# Patient Record
Sex: Male | Born: 1975 | Race: Black or African American | Hispanic: No | Marital: Single | Smoking: Current every day smoker
Health system: Southern US, Community
[De-identification: ages and names within clinical notes are randomized; demographics above are authoritative.]

## PROBLEM LIST (undated history)

## (undated) DIAGNOSIS — I10 Essential (primary) hypertension: Secondary | ICD-10-CM

## (undated) DIAGNOSIS — J45909 Unspecified asthma, uncomplicated: Secondary | ICD-10-CM

## (undated) DIAGNOSIS — R0981 Nasal congestion: Secondary | ICD-10-CM

---

## 2003-06-25 ENCOUNTER — Emergency Department (HOSPITAL_COMMUNITY): Admission: EM | Admit: 2003-06-25 | Discharge: 2003-06-25 | Payer: Self-pay | Admitting: Emergency Medicine

## 2018-03-05 ENCOUNTER — Emergency Department (HOSPITAL_COMMUNITY)
Admission: EM | Admit: 2018-03-05 | Discharge: 2018-03-05 | Disposition: A | Discharge status: Home / Self Care | Payer: Self-pay | Attending: Emergency Medicine | Admitting: Emergency Medicine

## 2018-03-05 ENCOUNTER — Other Ambulatory Visit: Payer: Self-pay

## 2018-03-05 ENCOUNTER — Emergency Department (HOSPITAL_COMMUNITY): Payer: Self-pay

## 2018-03-05 ENCOUNTER — Encounter (HOSPITAL_COMMUNITY): Payer: Self-pay

## 2018-03-05 DIAGNOSIS — J101 Influenza due to other identified influenza virus with other respiratory manifestations: Secondary | ICD-10-CM | POA: Insufficient documentation

## 2018-03-05 DIAGNOSIS — I10 Essential (primary) hypertension: Secondary | ICD-10-CM | POA: Insufficient documentation

## 2018-03-05 DIAGNOSIS — Z79899 Other long term (current) drug therapy: Secondary | ICD-10-CM | POA: Insufficient documentation

## 2018-03-05 HISTORY — DX: Essential (primary) hypertension: I10

## 2018-03-05 LAB — CBC
HCT: 46.9 % (ref 39.0–52.0)
Hemoglobin: 16.6 g/dL (ref 13.0–17.0)
MCH: 32.5 pg (ref 26.0–34.0)
MCHC: 35.4 g/dL (ref 30.0–36.0)
MCV: 91.8 fL (ref 78.0–100.0)
PLATELETS: 165 10*3/uL (ref 150–400)
RBC: 5.11 MIL/uL (ref 4.22–5.81)
RDW: 13.1 % (ref 11.5–15.5)
WBC: 4 10*3/uL (ref 4.0–10.5)

## 2018-03-05 LAB — BASIC METABOLIC PANEL
Anion gap: 11 (ref 5–15)
BUN: 9 mg/dL (ref 6–20)
CALCIUM: 8.5 mg/dL — AB (ref 8.9–10.3)
CHLORIDE: 102 mmol/L (ref 101–111)
CO2: 23 mmol/L (ref 22–32)
CREATININE: 1.27 mg/dL — AB (ref 0.61–1.24)
GFR calc non Af Amer: >60 mL/min (ref >60)
Glucose, Bld: 113 mg/dL — ABNORMAL HIGH (ref 65–99)
Potassium: 3.2 mmol/L — ABNORMAL LOW (ref 3.5–5.1)
SODIUM: 136 mmol/L (ref 135–145)

## 2018-03-05 LAB — I-STAT TROPONIN, ED: TROPONIN I, POC: 0.02 ng/mL (ref 0.00–0.08)

## 2018-03-05 MED ORDER — KETOROLAC TROMETHAMINE 30 MG/ML IJ SOLN
15.0000 mg | Freq: Once | INTRAMUSCULAR | Status: AC
  Administered 2018-03-05: 15 mg via INTRAVENOUS
  Filled 2018-03-05: qty 1

## 2018-03-05 MED ORDER — METOCLOPRAMIDE HCL 5 MG/ML IJ SOLN
10.0000 mg | Freq: Once | INTRAMUSCULAR | Status: AC
  Administered 2018-03-05: 10 mg via INTRAVENOUS
  Filled 2018-03-05: qty 2

## 2018-03-05 MED ORDER — CETIRIZINE-PSEUDOEPHEDRINE ER 5-120 MG PO TB12: 1.0000 | ORAL_TABLET | Freq: Every day | ORAL | 0 refills | Status: DC

## 2018-03-05 MED ORDER — SODIUM CHLORIDE 0.9 % IV BOLUS (SEPSIS)
1000.0000 mL | Freq: Once | INTRAVENOUS | Status: AC
  Administered 2018-03-05: 1000 mL via INTRAVENOUS

## 2018-03-05 MED ORDER — ACETAMINOPHEN 325 MG PO TABS
650.0000 mg | ORAL_TABLET | Freq: Once | ORAL | Status: AC | PRN
  Administered 2018-03-05: 650 mg via ORAL
  Filled 2018-03-05: qty 2

## 2018-03-05 MED ORDER — ONDANSETRON HCL 4 MG PO TABS: 4.0000 mg | ORAL_TABLET | Freq: Four times a day (QID) | ORAL | 0 refills | Status: DC

## 2018-03-05 MED ORDER — BENZONATATE 100 MG PO CAPS: 100.0000 mg | ORAL_CAPSULE | Freq: Three times a day (TID) | ORAL | 0 refills | Status: DC

## 2018-03-05 MED ORDER — ALBUTEROL SULFATE (2.5 MG/3ML) 0.083% IN NEBU
5.0000 mg | INHALATION_SOLUTION | Freq: Once | RESPIRATORY_TRACT | Status: AC
  Administered 2018-03-05: 5 mg via RESPIRATORY_TRACT
  Filled 2018-03-05: qty 6

## 2018-03-05 NOTE — Discharge Instructions (Signed)
Alternate ibuprofen and Tylenol as prescribed over-the-counter every 4 hours for fever and body aches. Please follow-up with your doctor if your symptoms are persisting after the next week.  Please return to emergency department if you develop any new or worsening symptoms.

## 2018-03-05 NOTE — ED Triage Notes (Signed)
Pt states he has had cough, shortness of breath, sinus pain. Pt states he has been feeling bad X2 days. Pt also reports dry mouth. No medical hx per patient.

## 2018-03-05 NOTE — ED Provider Notes (Signed)
[facility] EMERGENCY DEPARTMENT Provider Note   CSN: [id number] Arrival date & time: 03/05/18  0758     History   Chief Complaint Chief Complaint  Patient presents with  . URI  . Headache    HPI Andre Dean is a 42 y.o. male with history of hypertension who presents with a 4-day history of cough, nasal congestion, fever, body aches.  Patient was seen at urgent care 2 days ago and diagnosed with influenza A.  He has had some associated shortness of breath, headache.  He has been taking over-the-counter Corcidin and an over-the-counter cold and flu medication.  He has had some associated nausea, but no abdominal pain or vomiting, or diarrhea.  He has had some soreness in his chest with coughing.  HPI  Past Medical History:  Diagnosis Date  . Hypertension     There are no active problems to display for this patient.   History reviewed. No pertinent surgical history.     Home Medications    Prior to Admission medications   Medication Sig Start Date End Date Taking? Authorizing Provider  acetaminophen (TYLENOL) 500 MG tablet Take 1,000 mg by mouth every 6 (six) hours as needed for mild pain.   Yes [provider]  Black Elderberry (SAMBUCUS ELDERBERRY PO) Take 1 tablet by mouth 3 (three) times daily.   Yes [provider]  Black Elderberry (SAMBUCUS ELDERBERRY) 50 MG/5ML SYRP Take 10 mLs by mouth every 6 (six) hours as needed (cough).   Yes [provider]  benzonatate (TESSALON) 100 MG capsule Take 1 capsule (100 mg total) by mouth every 8 (eight) hours. 03/05/18   Myleigh Amara, Waylan Boga, PA-C  cetirizine-pseudoephedrine (ZYRTEC-D) 5-120 MG tablet Take 1 tablet by mouth daily. 03/05/18   Demaryius Imran, Waylan Boga, PA-C  ondansetron (ZOFRAN) 4 MG tablet Take 1 tablet (4 mg total) by mouth every 6 (six) hours. 03/05/18   Emi Holes, PA-C    Family History History reviewed. No pertinent family history.  Social History Social History    Tobacco Use  . Smoking status: Never Smoker  . Smokeless tobacco: Never Used  Substance Use Topics  . Alcohol use: Yes    Comment: occasional  . Drug use: No     Allergies   Patient has no known allergies.   Review of Systems Review of Systems  Constitutional: Positive for chills and fever.  HENT: Positive for congestion and sore throat. Negative for facial swelling.   Respiratory: Positive for cough and shortness of breath.   Cardiovascular: Negative for chest pain.  Gastrointestinal: Positive for nausea. Negative for abdominal pain and vomiting.  Genitourinary: Negative for dysuria.  Musculoskeletal: Positive for myalgias. Negative for back pain.  Skin: Negative for rash and wound.  Neurological: Negative for headaches.  Psychiatric/Behavioral: The patient is not nervous/anxious.      Physical Exam Updated Vital Signs BP (!) 160/109   Pulse 84   Temp (!) 101.2 F (38.4 C) (Oral)   Resp 18   SpO2 94%   Physical Exam  Constitutional: He appears well-developed and well-nourished. No distress.  HENT:  Head: Normocephalic and atraumatic.  Mouth/Throat: Oropharynx is clear and moist. No oropharyngeal exudate.  Eyes: Conjunctivae are normal. Pupils are equal, round, and reactive to light. Right eye exhibits no discharge. Left eye exhibits no discharge. No scleral icterus.  Neck: Normal range of motion. Neck supple. No thyromegaly present.  Cardiovascular: Normal rate, regular rhythm, normal heart sounds and intact distal pulses. Exam  reveals no gallop and no friction rub.  No murmur heard. Pulmonary/Chest: Effort normal. No stridor. No respiratory distress. He has wheezes. He has no rales.  Abdominal: Soft. Bowel sounds are normal. He exhibits no distension. There is no tenderness. There is no rebound and no guarding.  Musculoskeletal: He exhibits no edema.  Lymphadenopathy:    He has no cervical adenopathy.  Neurological: He is alert. Coordination normal.  Skin:  Skin is warm and dry. No rash noted. He is not diaphoretic. No pallor.  Psychiatric: He has a normal mood and affect.  Nursing note and vitals reviewed.    ED Treatments / Results  Labs (all labs ordered are listed, but only abnormal results are displayed) Labs Reviewed  BASIC METABOLIC PANEL - Abnormal; Notable for the following components:      Result Value   Potassium 3.2 (*)    Glucose, Bld 113 (*)    Creatinine, Ser 1.27 (*)    Calcium 8.5 (*)    All other components within normal limits  CBC  I-STAT TROPONIN, ED    EKG  EKG Interpretation  Date/Time:  Thursday March 05 2018 08:10:17 EST Ventricular Rate:  94 PR Interval:  184 QRS Duration: 88 QT Interval:  372 QTC Calculation: 465 R Axis:   70 Text Interpretation:  Sinus rhythm with occasional Premature ventricular complexes Biatrial enlargement Left ventricular hypertrophy Nonspecific T wave abnormality No previous tracing Confirmed by Cathren LaineSteinl, Kevin ([id number]) on 03/05/2018 1:55:46 PM       Radiology Dg Chest 2 View  Result Date: 03/05/2018 CLINICAL DATA:  Cough, shortness of breath. EXAM: CHEST - 2 VIEW COMPARISON:  None. FINDINGS: Borderline cardiomegaly. Normal pulmonary vascularity. No focal consolidation, pleural effusion, or pneumothorax. No acute osseous abnormality. IMPRESSION: No active cardiopulmonary disease. Electronically Signed   By: Obie DredgeWilliam T Derry M.D.   On: 03/05/2018 09:36    Procedures Procedures (including critical care time)  Medications Ordered in ED Medications  acetaminophen (TYLENOL) tablet 650 mg (650 mg Oral Given 03/05/18 0813)  ketorolac (TORADOL) 30 MG/ML injection 15 mg (15 mg Intravenous Given 03/05/18 1221)  sodium chloride 0.9 % bolus 1,000 mL (0 mLs Intravenous Stopped 03/05/18 1328)  metoCLOPramide (REGLAN) injection 10 mg (10 mg Intravenous Given 03/05/18 1221)  albuterol (PROVENTIL) (2.5 MG/3ML) 0.083% nebulizer solution 5 mg (5 mg Nebulization Given 03/05/18 1221)     Initial  Impression / Assessment and Plan / ED Course  I have reviewed the triage vital signs and the nursing notes.  Pertinent labs & imaging results that were available during my care of the patient were reviewed by me and considered in my medical decision making (see chart for details).     Patient with influenza a.  CBC unremarkable.  BMP shows mild hypokalemia, 3.2, creatinine 1.27.  Troponin negative.  Chest x-ray is negative.  Patient feeling much better after albuterol nebulizer and headache cocktail and would like to leave.  He is dressed and ready to go before I could re-evaluate, and no repeat vitals could be assessed prior to discharge.  Will discharge home with Zofran, Tessalon, Zyrtec-D.  Follow-up to PCP for recheck.  Return precautions discussed.  Patient understands and agrees with plan.  Patient discharged in satisfactory condition.  Final Clinical Impressions(s) / ED Diagnoses   Final diagnoses:  Influenza A    ED Discharge Orders        Ordered    ondansetron (ZOFRAN) 4 MG tablet  Every 6 hours     03/05/18 1403  benzonatate (TESSALON) 100 MG capsule  Every 8 hours     03/05/18 1403    cetirizine-pseudoephedrine (ZYRTEC-D) 5-120 MG tablet  Daily     03/05/18 1403       Emi Holes, PA-C 03/05/18 1636    Cathren Laine, MD 03/06/18 [phone]

## 2018-06-19 DIAGNOSIS — J302 Other seasonal allergic rhinitis: Secondary | ICD-10-CM | POA: Insufficient documentation

## 2018-06-19 DIAGNOSIS — J45909 Unspecified asthma, uncomplicated: Secondary | ICD-10-CM | POA: Insufficient documentation

## 2018-06-19 DIAGNOSIS — R519 Headache, unspecified: Secondary | ICD-10-CM | POA: Insufficient documentation

## 2018-06-19 DIAGNOSIS — J4540 Moderate persistent asthma, uncomplicated: Secondary | ICD-10-CM | POA: Insufficient documentation

## 2018-06-19 DIAGNOSIS — B353 Tinea pedis: Secondary | ICD-10-CM | POA: Insufficient documentation

## 2018-06-19 DIAGNOSIS — I10 Essential (primary) hypertension: Secondary | ICD-10-CM | POA: Insufficient documentation

## 2018-09-04 ENCOUNTER — Emergency Department (HOSPITAL_COMMUNITY)
Admission: EM | Admit: 2018-09-04 | Discharge: 2018-09-05 | Disposition: A | Discharge status: Home / Self Care | Payer: Self-pay | Attending: Emergency Medicine | Admitting: Emergency Medicine

## 2018-09-04 ENCOUNTER — Encounter (HOSPITAL_COMMUNITY): Payer: Self-pay | Admitting: Emergency Medicine

## 2018-09-04 ENCOUNTER — Other Ambulatory Visit: Payer: Self-pay

## 2018-09-04 DIAGNOSIS — J9801 Acute bronchospasm: Secondary | ICD-10-CM | POA: Insufficient documentation

## 2018-09-04 DIAGNOSIS — Z79899 Other long term (current) drug therapy: Secondary | ICD-10-CM | POA: Insufficient documentation

## 2018-09-04 DIAGNOSIS — I10 Essential (primary) hypertension: Secondary | ICD-10-CM | POA: Insufficient documentation

## 2018-09-04 DIAGNOSIS — F17210 Nicotine dependence, cigarettes, uncomplicated: Secondary | ICD-10-CM | POA: Insufficient documentation

## 2018-09-04 DIAGNOSIS — J069 Acute upper respiratory infection, unspecified: Secondary | ICD-10-CM

## 2018-09-04 DIAGNOSIS — R9431 Abnormal electrocardiogram [ECG] [EKG]: Secondary | ICD-10-CM | POA: Insufficient documentation

## 2018-09-04 HISTORY — DX: Nasal congestion: R09.81

## 2018-09-04 HISTORY — DX: Unspecified asthma, uncomplicated: J45.909

## 2018-09-04 NOTE — ED Triage Notes (Addendum)
Pt reports 8/10 central chest pressure without radiation that started tonight. Pt reports sob that has been going on for the last week that has gotten worse. Pt reports attempting breathing treatments at home without any relief. Pt reports taking medications tonight and taking a breathing treatment before arrival to hospital. Pt also endorses headache. Hx of HTN and is compliant with medications.

## 2018-09-05 ENCOUNTER — Encounter (HOSPITAL_COMMUNITY): Payer: Self-pay | Admitting: Emergency Medicine

## 2018-09-05 ENCOUNTER — Emergency Department (HOSPITAL_COMMUNITY): Payer: Self-pay

## 2018-09-05 LAB — I-STAT TROPONIN, ED: TROPONIN I, POC: 0.03 ng/mL (ref 0.00–0.08)

## 2018-09-05 LAB — BASIC METABOLIC PANEL
ANION GAP: 12 (ref 5–15)
BUN: 7 mg/dL (ref 6–20)
CALCIUM: 9.8 mg/dL (ref 8.9–10.3)
CO2: 26 mmol/L (ref 22–32)
CREATININE: 1.32 mg/dL — AB (ref 0.61–1.24)
Chloride: 103 mmol/L (ref 98–111)
GFR calc Af Amer: >60 mL/min (ref >60)
GLUCOSE: 119 mg/dL — AB (ref 70–99)
Potassium: 3.4 mmol/L — ABNORMAL LOW (ref 3.5–5.1)
Sodium: 141 mmol/L (ref 135–145)

## 2018-09-05 LAB — CBC
HCT: 51.8 % (ref 39.0–52.0)
HEMOGLOBIN: 17.3 g/dL — AB (ref 13.0–17.0)
MCH: 32 pg (ref 26.0–34.0)
MCHC: 33.4 g/dL (ref 30.0–36.0)
MCV: 95.9 fL (ref 78.0–100.0)
PLATELETS: 252 10*3/uL (ref 150–400)
RBC: 5.4 MIL/uL (ref 4.22–5.81)
RDW: 13.4 % (ref 11.5–15.5)
WBC: 7.7 10*3/uL (ref 4.0–10.5)

## 2018-09-05 MED ORDER — AZITHROMYCIN 250 MG PO TABS
500.0000 mg | ORAL_TABLET | Freq: Once | ORAL | Status: AC
  Administered 2018-09-05: 500 mg via ORAL
  Filled 2018-09-05: qty 2

## 2018-09-05 MED ORDER — IPRATROPIUM BROMIDE 0.02 % IN SOLN
0.5000 mg | Freq: Once | RESPIRATORY_TRACT | Status: AC
  Administered 2018-09-05: 0.5 mg via RESPIRATORY_TRACT
  Filled 2018-09-05: qty 2.5

## 2018-09-05 MED ORDER — PREDNISONE 10 MG PO TABS: ORAL_TABLET | ORAL | 0 refills | Status: DC

## 2018-09-05 MED ORDER — ALBUTEROL SULFATE (2.5 MG/3ML) 0.083% IN NEBU
5.0000 mg | INHALATION_SOLUTION | Freq: Once | RESPIRATORY_TRACT | Status: AC
  Administered 2018-09-05: 5 mg via RESPIRATORY_TRACT
  Filled 2018-09-05: qty 6

## 2018-09-05 MED ORDER — PREDNISONE 20 MG PO TABS
60.0000 mg | ORAL_TABLET | Freq: Once | ORAL | Status: AC
  Administered 2018-09-05: 60 mg via ORAL
  Filled 2018-09-05: qty 3

## 2018-09-05 MED ORDER — AZITHROMYCIN 250 MG PO TABS: 250.0000 mg | ORAL_TABLET | Freq: Every day | ORAL | 0 refills | Status: DC

## 2018-09-05 NOTE — Discharge Instructions (Addendum)
You have been offered admission for both your wheezing and poor oxygenation, as well as your abnormal EKG that could represent an ischemic heart condition. You have refused admission. Please follow up with your doctor for recheck of your wheezing/upper respiratory infection, high blood pressure, abnormal EKG next week. REturn here with any worsening symptoms.

## 2018-09-05 NOTE — ED Notes (Signed)
Discussed implications of signing out AMA upon discharging patient, pt verbalized understanding and stated "I don't need to be admitted." ambulatory out of ED with family member, nad.

## 2018-09-05 NOTE — ED Provider Notes (Signed)
[facility] EMERGENCY DEPARTMENT Provider Note   CSN: [id number] Arrival date & time: 09/04/18  2335     History   Chief Complaint Chief Complaint  Patient presents with  . Chest Pain  . Shortness of Breath    HPI Quantavis Rivett is a 42 y.o. male.  Patient presents with SOB and chest tightness that has been progressing over the last one week. No modifying factors. No nausea, vomiting, fever. He has been using a nebulizer machine at home without change in symptoms. He reports nebulizer prescribed for recent diagnosis of asthma (within last 6 months). No diaphoresis, vomiting. No family history of heart disease. He is a smoker. The chest tightness is located in the central chest without radiation.   The history is provided by the patient. No language interpreter was used.    Past Medical History:  Diagnosis Date  . Asthma   . Hypertension   . Sinus congestion     There are no active problems to display for this patient.   History reviewed. No pertinent surgical history.      Home Medications    Prior to Admission medications   Medication Sig Start Date End Date Taking? Authorizing Provider  acetaminophen (TYLENOL) 500 MG tablet Take 1,000 mg by mouth every 6 (six) hours as needed for mild pain.   Yes [provider]  albuterol (PROAIR HFA) 108 (90 Base) MCG/ACT inhaler Inhale 1-2 puffs into the lungs every 4 (four) hours as needed for wheezing.    Yes [provider]  Black Elderberry (SAMBUCUS ELDERBERRY PO) Take 1 tablet by mouth 3 (three) times daily.   Yes [provider]  cloNIDine (CATAPRES) 0.1 MG tablet Take 0.1 mg by mouth 2 (two) times daily.   Yes [provider]  hydrALAZINE (APRESOLINE) 100 MG tablet Take 100 mg by mouth 2 (two) times daily.   Yes [provider]  hydrochlorothiazide (HYDRODIURIL) 25 MG tablet Take 25 mg by mouth daily.   Yes [provider]  lisinopril  (PRINIVIL,ZESTRIL) 40 MG tablet Take 40 mg by mouth daily.   Yes [provider]  potassium chloride (MICRO-K) 10 MEQ CR capsule Take 10 mEq by mouth daily.   Yes [provider]  benzonatate (TESSALON) 100 MG capsule Take 1 capsule (100 mg total) by mouth every 8 (eight) hours. Patient not taking: Reported on 09/05/2018 03/05/18   Emi Holes, PA-C  cetirizine-pseudoephedrine (ZYRTEC-D) 5-120 MG tablet Take 1 tablet by mouth daily. Patient not taking: Reported on 09/05/2018 03/05/18   Emi Holes, PA-C  ondansetron (ZOFRAN) 4 MG tablet Take 1 tablet (4 mg total) by mouth every 6 (six) hours. Patient not taking: Reported on 09/05/2018 03/05/18   Emi Holes, PA-C    Family History No family history on file.  Social History Social History   Tobacco Use  . Smoking status: Current Every Day Smoker    Packs/day: 1.00    Years: 1.00    Pack years: 1.00    Types: Cigarettes  . Smokeless tobacco: Never Used  Substance Use Topics  . Alcohol use: Yes    Comment: occasional  . Drug use: No     Allergies   Patient has no known allergies.   Review of Systems Review of Systems  Constitutional: Negative for chills and fever.  HENT: Negative.  Negative for congestion and sinus pain.   Respiratory: Positive for chest tightness, shortness of breath and wheezing.   Cardiovascular: Negative.  Negative for leg swelling.  Gastrointestinal: Negative.  Negative for abdominal pain and vomiting.  Musculoskeletal: Negative.   Skin: Negative.   Neurological: Negative.      Physical Exam Updated Vital Signs BP (!) 155/103   Pulse 94   Temp (!) 97.5 F (36.4 C) (Oral)   Resp 16   Ht 6\' 2"  (1.88 m)   Wt 122.5 kg   SpO2 95%   BMI 34.67 kg/m   Physical Exam  Constitutional: He is oriented to person, place, and time. He appears well-developed and well-nourished.  HENT:  Head: Normocephalic.  Neck: Normal range of motion. Neck supple.  Cardiovascular: Normal rate  and regular rhythm.  Pulmonary/Chest: Effort normal. He has wheezes. He has no rhonchi. He has no rales.  Abdominal: Soft. Bowel sounds are normal. There is no tenderness. There is no rebound and no guarding.  Musculoskeletal: Normal range of motion.       Right lower leg: He exhibits no edema.       Left lower leg: He exhibits no edema.  Neurological: He is alert and oriented to person, place, and time.  Skin: Skin is warm and dry. No rash noted.  Psychiatric: He has a normal mood and affect.  Nursing note and vitals reviewed.    ED Treatments / Results  Labs (all labs ordered are listed, but only abnormal results are displayed) Labs Reviewed  BASIC METABOLIC PANEL - Abnormal; Notable for the following components:      Result Value   Potassium 3.4 (*)    Glucose, Bld 119 (*)    Creatinine, Ser 1.32 (*)    All other components within normal limits  CBC - Abnormal; Notable for the following components:   Hemoglobin 17.3 (*)    All other components within normal limits  I-STAT TROPONIN, ED    EKG EKG Interpretation  Date/Time:  Friday September 04 2018 23:39:16 EDT Ventricular Rate:  106 PR Interval:  168 QRS Duration: 96 QT Interval:  370 QTC Calculation: 491 R Axis:   54 Text Interpretation:  Sinus tachycardia with Premature atrial complexes Biatrial enlargement T wave abnormality, consider lateral ischemia Confirmed by Nicanor Alcon, April ([id number]) on 09/05/2018 2:34:21 AM   Radiology Dg Chest 2 View  Result Date: 09/05/2018 CLINICAL DATA:  Chest pain and shortness of breath. Central chest pressure starting tonight. Shortness of breath for a week. History of hypertension. EXAM: CHEST - 2 VIEW COMPARISON:  03/05/2018 FINDINGS: The heart size and mediastinal contours are within normal limits. Both lungs are clear. The visualized skeletal structures are unremarkable. IMPRESSION: No active cardiopulmonary disease. Electronically Signed   By: Burman Nieves M.D.   On: 09/05/2018  00:12    Procedures Procedures (including critical care time)  Medications Ordered in ED Medications - No data to display   Initial Impression / Assessment and Plan / ED Course  I have reviewed the triage vital signs and the nursing notes.  Pertinent labs & imaging results that were available during my care of the patient were reviewed by me and considered in my medical decision making (see chart for details).     Patient here with SOB and chest pressure for the last one week, getting worse. No URI symptoms, fever, vomiting. Using home nebulizers without relief. He is found to be hypertensive here. Reports he is compliant with medications for blood pressure. No recent changes.   He has significant wheezes despite multiple treatments. The patient is offered admission given O2 saturation is around 92%  after txs x 2. He states he knows all he needs is an antibiotic and prednisone and he'll be fine.   He is also found to have EKG changes concerning for ischemia. This was discussed with the patient as well and admission for further evaluation was recommended. The patient declines admission. Discussed that an ischemic condition could not be ruled out and there was a risk of worsening condition or even death if he left without further evaluation. The patient acknowledges understanding and still refuses to stay. Discussed AMA discharge if he chose to leave and patient understands the implications of this.   Final Clinical Impressions(s) / ED Diagnoses   Final diagnoses:  None   1. Bronchospasm 2. Abnormal EKG  ED Discharge Orders    None       Elpidio Anis, Cordelia Poche 09/08/18 1610    Palumbo, April, MD 09/10/18 0011

## 2018-09-05 NOTE — ED Notes (Signed)
PA Upstill aware of patient's tachycardia and low O2 sat and continued bp elevation, Provider talked to patient about risks of leaving hospital at this time, however, patient refused admission.

## 2019-01-08 DIAGNOSIS — J343 Hypertrophy of nasal turbinates: Secondary | ICD-10-CM | POA: Diagnosis not present

## 2019-01-08 DIAGNOSIS — J310 Chronic rhinitis: Secondary | ICD-10-CM | POA: Diagnosis not present

## 2019-01-08 DIAGNOSIS — J342 Deviated nasal septum: Secondary | ICD-10-CM | POA: Diagnosis not present

## 2019-01-11 DIAGNOSIS — I10 Essential (primary) hypertension: Secondary | ICD-10-CM | POA: Diagnosis not present

## 2019-01-14 ENCOUNTER — Other Ambulatory Visit: Payer: Self-pay | Admitting: Otolaryngology

## 2019-01-14 DIAGNOSIS — R062 Wheezing: Secondary | ICD-10-CM | POA: Diagnosis not present

## 2019-01-14 DIAGNOSIS — J329 Chronic sinusitis, unspecified: Secondary | ICD-10-CM

## 2019-01-14 DIAGNOSIS — J309 Allergic rhinitis, unspecified: Secondary | ICD-10-CM | POA: Diagnosis not present

## 2019-01-14 DIAGNOSIS — J4541 Moderate persistent asthma with (acute) exacerbation: Secondary | ICD-10-CM | POA: Diagnosis not present

## 2019-01-14 DIAGNOSIS — J4540 Moderate persistent asthma, uncomplicated: Secondary | ICD-10-CM | POA: Diagnosis not present

## 2019-01-21 ENCOUNTER — Ambulatory Visit
Admission: RE | Admit: 2019-01-21 | Discharge: 2019-01-21 | Disposition: A | Discharge status: Home / Self Care | Payer: BLUE CROSS/BLUE SHIELD | Source: Ambulatory Visit | Attending: Otolaryngology | Admitting: Otolaryngology

## 2019-01-21 DIAGNOSIS — J329 Chronic sinusitis, unspecified: Secondary | ICD-10-CM

## 2019-01-21 DIAGNOSIS — J322 Chronic ethmoidal sinusitis: Secondary | ICD-10-CM | POA: Diagnosis not present

## 2019-02-24 DIAGNOSIS — J4540 Moderate persistent asthma, uncomplicated: Secondary | ICD-10-CM | POA: Diagnosis not present

## 2019-02-24 DIAGNOSIS — J3081 Allergic rhinitis due to animal (cat) (dog) hair and dander: Secondary | ICD-10-CM | POA: Diagnosis not present

## 2019-02-24 DIAGNOSIS — J3089 Other allergic rhinitis: Secondary | ICD-10-CM | POA: Diagnosis not present

## 2019-02-24 DIAGNOSIS — J301 Allergic rhinitis due to pollen: Secondary | ICD-10-CM | POA: Diagnosis not present

## 2019-03-15 DIAGNOSIS — J3089 Other allergic rhinitis: Secondary | ICD-10-CM | POA: Diagnosis not present

## 2019-03-15 DIAGNOSIS — J4540 Moderate persistent asthma, uncomplicated: Secondary | ICD-10-CM | POA: Diagnosis not present

## 2019-03-15 DIAGNOSIS — I10 Essential (primary) hypertension: Secondary | ICD-10-CM | POA: Diagnosis not present

## 2019-03-15 DIAGNOSIS — G4733 Obstructive sleep apnea (adult) (pediatric): Secondary | ICD-10-CM | POA: Diagnosis not present

## 2019-03-17 DIAGNOSIS — J4540 Moderate persistent asthma, uncomplicated: Secondary | ICD-10-CM | POA: Diagnosis not present

## 2019-04-13 DIAGNOSIS — R509 Fever, unspecified: Secondary | ICD-10-CM | POA: Diagnosis not present

## 2019-05-20 ENCOUNTER — Other Ambulatory Visit (HOSPITAL_BASED_OUTPATIENT_CLINIC_OR_DEPARTMENT_OTHER): Payer: Self-pay

## 2019-05-20 DIAGNOSIS — R0683 Snoring: Secondary | ICD-10-CM

## 2019-05-20 DIAGNOSIS — R5383 Other fatigue: Secondary | ICD-10-CM

## 2019-05-20 DIAGNOSIS — G4710 Hypersomnia, unspecified: Secondary | ICD-10-CM

## 2019-06-21 ENCOUNTER — Ambulatory Visit (HOSPITAL_BASED_OUTPATIENT_CLINIC_OR_DEPARTMENT_OTHER): Payer: BC Managed Care – PPO

## 2019-06-25 ENCOUNTER — Other Ambulatory Visit: Payer: Self-pay

## 2019-06-25 ENCOUNTER — Encounter: Payer: Self-pay | Admitting: Podiatry

## 2019-06-25 ENCOUNTER — Ambulatory Visit (INDEPENDENT_AMBULATORY_CARE_PROVIDER_SITE_OTHER): Payer: BC Managed Care – PPO | Admitting: Podiatry

## 2019-06-25 ENCOUNTER — Ambulatory Visit (INDEPENDENT_AMBULATORY_CARE_PROVIDER_SITE_OTHER): Payer: BC Managed Care – PPO

## 2019-06-25 VITALS — Temp 97.9°F

## 2019-06-25 DIAGNOSIS — M2141 Flat foot [pes planus] (acquired), right foot: Secondary | ICD-10-CM | POA: Diagnosis not present

## 2019-06-25 DIAGNOSIS — Z131 Encounter for screening for diabetes mellitus: Secondary | ICD-10-CM | POA: Diagnosis not present

## 2019-06-25 DIAGNOSIS — B351 Tinea unguium: Secondary | ICD-10-CM

## 2019-06-25 DIAGNOSIS — Z136 Encounter for screening for cardiovascular disorders: Secondary | ICD-10-CM | POA: Diagnosis not present

## 2019-06-25 DIAGNOSIS — M722 Plantar fascial fibromatosis: Secondary | ICD-10-CM

## 2019-06-25 DIAGNOSIS — M779 Enthesopathy, unspecified: Secondary | ICD-10-CM

## 2019-06-25 DIAGNOSIS — M2142 Flat foot [pes planus] (acquired), left foot: Secondary | ICD-10-CM

## 2019-06-25 DIAGNOSIS — Z79899 Other long term (current) drug therapy: Secondary | ICD-10-CM

## 2019-06-25 DIAGNOSIS — I10 Essential (primary) hypertension: Secondary | ICD-10-CM | POA: Diagnosis not present

## 2019-06-25 MED ORDER — MELOXICAM 15 MG PO TABS: 15.0000 mg | ORAL_TABLET | Freq: Every day | ORAL | 0 refills | Status: DC

## 2019-06-25 NOTE — Patient Instructions (Addendum)
Terbinafine oral granules What is this medicine? TERBINAFINE (TER bin a feen) is an antifungal medicine. It is used to treat certain kinds of fungal or yeast infections. This medicine may be used for other purposes; ask your health care provider or pharmacist if you have questions. COMMON BRAND NAME(S): Lamisil What should I tell my health care provider before I take this medicine? They need to know if you have any of these conditions: -drink alcoholic beverages -kidney disease -liver disease -an unusual or allergic reaction to Terbinafine, other medicines, foods, dyes, or preservatives -pregnant or trying to get pregnant -breast-feeding How should I use this medicine? Take this medicine by mouth. Follow the directions on the prescription label. Hold packet with cut line on top. Shake packet gently to settle contents. Tear packet open along cut line, or use scissors to cut across line. Carefully pour the entire contents of packet onto a spoonful of a soft food, such as pudding or other soft, non-acidic food such as mashed potatoes (do NOT use applesauce or a fruit-based food). If two packets are required for each dose, you may either sprinkle the content of both packets on one spoonful of non-acidic food, or sprinkle the contents of both packets on two spoonfuls of non-acidic food. Make sure that no granules remain in the packet. Swallow the mxiture of the food and granules without chewing. Take your medicine at regular intervals. Do not take it more often than directed. Take all of your medicine as directed even if you think you are better. Do not skip doses or stop your medicine early. Contact your pediatrician or health care professional regarding the use of this medicine in children. While this medicine may be prescribed for children as young as 4 years for selected conditions, precautions do apply. Overdosage: If you think you have taken too much of this medicine contact a poison control center  or emergency room at once. NOTE: This medicine is only for you. Do not share this medicine with others. What if I miss a dose? If you miss a dose, take it as soon as you can. If it is almost time for your next dose, take only that dose. Do not take double or extra doses. What may interact with this medicine? Do not take this medicine with any of the following medications: -thioridazine This medicine may also interact with the following medications: -beta-blockers -caffeine -cimetidine -cyclosporine -MAOIs like Carbex, Eldepryl, Marplan, Nardil, and Parnate -medicines for fungal infections like fluconazole and ketoconazole -medicines for irregular heartbeat like amiodarone, flecainide and propafenone -rifampin -SSRIs like citalopram, escitalopram, fluoxetine, fluvoxamine, paroxetine and sertraline -tricyclic antidepressants like amitriptyline, clomipramine, desipramine, imipramine, nortriptyline, and others -warfarin This list may not describe all possible interactions. Give your health care provider a list of all the medicines, herbs, non-prescription drugs, or dietary supplements you use. Also tell them if you smoke, drink alcohol, or use illegal drugs. Some items may interact with your medicine. What should I watch for while using this medicine? Your doctor may monitor your liver function. Tell your doctor right away if you have nausea or vomiting, loss of appetite, stomach pain on your right upper side, yellow skin, dark urine, light stools, or are over tired. You need to take this medicine for 6 weeks or longer to cure the fungal infection. Take your medicine regularly for as long as your doctor or health care professional tells you to. What side effects may I notice from receiving this medicine? Side effects that you should report   to your doctor or health care professional as soon as possible: -allergic reactions like skin rash or hives, swelling of the face, lips, or tongue -change in  vision -dark urine -fever or infection -general ill feeling or flu-like symptoms -light-colored stools -loss of appetite, nausea -redness, blistering, peeling or loosening of the skin, including inside the mouth -right upper belly pain -unusually weak or tired -yellowing of the eyes or skin Side effects that usually do not require medical attention (report to your doctor or health care professional if they continue or are bothersome): -changes in taste -diarrhea -hair loss -muscle or joint pain -stomach upset This list may not describe all possible side effects. Call your doctor for medical advice about side effects. You may report side effects to FDA at 1-800-FDA-1088. Where should I keep my medicine? Keep out of the reach of children. Store at room temperature between 15 and 30 degrees C (59 and 86 degrees F). Throw away any unused medicine after the expiration date. NOTE: This sheet is a summary. It may not cover all possible information. If you have questions about this medicine, talk to your doctor, pharmacist, or health care provider.  2019 Elsevier/Gold Standard (2008-02-26 17:25:48)   Plantar Fasciitis (Heel Spur Syndrome) with Rehab The plantar fascia is a fibrous, ligament-like, soft-tissue structure that spans the bottom of the foot. Plantar fasciitis is a condition that causes pain in the foot due to inflammation of the tissue. SYMPTOMS   Pain and tenderness on the underneath side of the foot.  Pain that worsens with standing or walking. CAUSES  Plantar fasciitis is caused by irritation and injury to the plantar fascia on the underneath side of the foot. Common mechanisms of injury include:  Direct trauma to bottom of the foot.  Damage to a small nerve that runs under the foot where the main fascia attaches to the heel bone.  Stress placed on the plantar fascia due to bone spurs. RISK INCREASES WITH:   Activities that place stress on the plantar fascia (running,  jumping, pivoting, or cutting).  Poor strength and flexibility.  Improperly fitted shoes.  Tight calf muscles.  Flat feet.  Failure to warm-up properly before activity.  Obesity. PREVENTION  Warm up and stretch properly before activity.  Allow for adequate recovery between workouts.  Maintain physical fitness:  Strength, flexibility, and endurance.  Cardiovascular fitness.  Maintain a health body weight.  Avoid stress on the plantar fascia.  Wear properly fitted shoes, including arch supports for individuals who have flat feet.  PROGNOSIS  If treated properly, then the symptoms of plantar fasciitis usually resolve without surgery. However, occasionally surgery is necessary.  RELATED COMPLICATIONS   Recurrent symptoms that may result in a chronic condition.  Problems of the lower back that are caused by compensating for the injury, such as limping.  Pain or weakness of the foot during push-off following surgery.  Chronic inflammation, scarring, and partial or complete fascia tear, occurring more often from repeated injections.  TREATMENT  Treatment initially involves the use of ice and medication to help reduce pain and inflammation. The use of strengthening and stretching exercises may help reduce pain with activity, especially stretches of the Achilles tendon. These exercises may be performed at home or with a therapist. Your caregiver may recommend that you use heel cups of arch supports to help reduce stress on the plantar fascia. Occasionally, corticosteroid injections are given to reduce inflammation. If symptoms persist for greater than 6 months despite non-surgical (conservative), then  surgery may be recommended.   MEDICATION   If pain medication is necessary, then nonsteroidal anti-inflammatory medications, such as aspirin and ibuprofen, or other minor pain relievers, such as acetaminophen, are often recommended.  Do not take pain medication within 7 days  before surgery.  Prescription pain relievers may be given if deemed necessary by your caregiver. Use only as directed and only as much as you need.  Corticosteroid injections may be given by your caregiver. These injections should be reserved for the most serious cases, because they may only be given a certain number of times.  HEAT AND COLD  Cold treatment (icing) relieves pain and reduces inflammation. Cold treatment should be applied for 10 to 15 minutes every 2 to 3 hours for inflammation and pain and immediately after any activity that aggravates your symptoms. Use ice packs or massage the area with a piece of ice (ice massage).  Heat treatment may be used prior to performing the stretching and strengthening activities prescribed by your caregiver, physical therapist, or athletic trainer. Use a heat pack or soak the injury in warm water.  SEEK IMMEDIATE MEDICAL CARE IF:  Treatment seems to offer no benefit, or the condition worsens.  Any medications produce adverse side effects.  EXERCISES- RANGE OF MOTION (ROM) AND STRETCHING EXERCISES - Plantar Fasciitis (Heel Spur Syndrome) These exercises may help you when beginning to rehabilitate your injury. Your symptoms may resolve with or without further involvement from your physician, physical therapist or athletic trainer. While completing these exercises, remember:   Restoring tissue flexibility helps normal motion to return to the joints. This allows healthier, less painful movement and activity.  An effective stretch should be held for at least 30 seconds.  A stretch should never be painful. You should only feel a gentle lengthening or release in the stretched tissue.  RANGE OF MOTION - Toe Extension, Flexion  Sit with your right / left leg crossed over your opposite knee.  Grasp your toes and gently pull them back toward the top of your foot. You should feel a stretch on the bottom of your toes and/or foot.  Hold this stretch  for 10 seconds.  Now, gently pull your toes toward the bottom of your foot. You should feel a stretch on the top of your toes and or foot.  Hold this stretch for 10 seconds. Repeat  times. Complete this stretch 3 times per day.   RANGE OF MOTION - Ankle Dorsiflexion, Active Assisted  Remove shoes and sit on a chair that is preferably not on a carpeted surface.  Place right / left foot under knee. Extend your opposite leg for support.  Keeping your heel down, slide your right / left foot back toward the chair until you feel a stretch at your ankle or calf. If you do not feel a stretch, slide your bottom forward to the edge of the chair, while still keeping your heel down.  Hold this stretch for 10 seconds. Repeat 3 times. Complete this stretch 2 times per day.   STRETCH  Gastroc, Standing  Place hands on wall.  Extend right / left leg, keeping the front knee somewhat bent.  Slightly point your toes inward on your back foot.  Keeping your right / left heel on the floor and your knee straight, shift your weight toward the wall, not allowing your back to arch.  You should feel a gentle stretch in the right / left calf. Hold this position for 10 seconds. Repeat 3 times.  Complete this stretch 2 times per day.  STRETCH  Soleus, Standing  Place hands on wall.  Extend right / left leg, keeping the other knee somewhat bent.  Slightly point your toes inward on your back foot.  Keep your right / left heel on the floor, bend your back knee, and slightly shift your weight over the back leg so that you feel a gentle stretch deep in your back calf.  Hold this position for 10 seconds. Repeat 3 times. Complete this stretch 2 times per day.  STRETCH  Gastrocsoleus, Standing  Note: This exercise can place a lot of stress on your foot and ankle. Please complete this exercise only if specifically instructed by your caregiver.   Place the ball of your right / left foot on a step, keeping your  other foot firmly on the same step.  Hold on to the wall or a rail for balance.  Slowly lift your other foot, allowing your body weight to press your heel down over the edge of the step.  You should feel a stretch in your right / left calf.  Hold this position for 10 seconds.  Repeat this exercise with a slight bend in your right / left knee. Repeat 3 times. Complete this stretch 2 times per day.   STRENGTHENING EXERCISES - Plantar Fasciitis (Heel Spur Syndrome)  These exercises may help you when beginning to rehabilitate your injury. They may resolve your symptoms with or without further involvement from your physician, physical therapist or athletic trainer. While completing these exercises, remember:   Muscles can gain both the endurance and the strength needed for everyday activities through controlled exercises.  Complete these exercises as instructed by your physician, physical therapist or athletic trainer. Progress the resistance and repetitions only as guided.  STRENGTH - Towel Curls  Sit in a chair positioned on a non-carpeted surface.  Place your foot on a towel, keeping your heel on the floor.  Pull the towel toward your heel by only curling your toes. Keep your heel on the floor. Repeat 3 times. Complete this exercise 2 times per day.  STRENGTH - Ankle Inversion  Secure one end of a rubber exercise band/tubing to a fixed object (table, pole). Loop the other end around your foot just before your toes.  Place your fists between your knees. This will focus your strengthening at your ankle.  Slowly, pull your big toe up and in, making sure the band/tubing is positioned to resist the entire motion.  Hold this position for 10 seconds.  Have your muscles resist the band/tubing as it slowly pulls your foot back to the starting position. Repeat 3 times. Complete this exercises 2 times per day.  Document Released: 12/16/2005 Document Revised: 03/09/2012 Document Reviewed:  03/30/2009 [facility] Patient Information 2014 [place], [place].

## 2019-06-26 LAB — CBC WITH DIFFERENTIAL/PLATELET
Basophils Absolute: 0 10*3/uL (ref 0.0–0.2)
Basos: 0 %
EOS (ABSOLUTE): 0.1 10*3/uL (ref 0.0–0.4)
Eos: 1 %
Hematocrit: 44.8 % (ref 37.5–51.0)
Hemoglobin: 15.6 g/dL (ref 13.0–17.7)
Immature Grans (Abs): 0 10*3/uL (ref 0.0–0.1)
Immature Granulocytes: 0 %
Lymphocytes Absolute: 1.9 10*3/uL (ref 0.7–3.1)
Lymphs: 44 %
MCH: 31.9 pg (ref 26.6–33.0)
MCHC: 34.8 g/dL (ref 31.5–35.7)
MCV: 92 fL (ref 79–97)
Monocytes Absolute: 0.4 10*3/uL (ref 0.1–0.9)
Monocytes: 10 %
Neutrophils Absolute: 2 10*3/uL (ref 1.4–7.0)
Neutrophils: 45 %
Platelets: 238 10*3/uL (ref 150–450)
RBC: 4.89 x10E6/uL (ref 4.14–5.80)
RDW: 13.2 % (ref 11.6–15.4)
WBC: 4.5 10*3/uL (ref 3.4–10.8)

## 2019-06-26 LAB — HEPATIC FUNCTION PANEL
ALT: 21 IU/L (ref 0–44)
AST: 30 IU/L (ref 0–40)
Albumin: 4.4 g/dL (ref 4.0–5.0)
Alkaline Phosphatase: 69 IU/L (ref 39–117)
Bilirubin Total: 0.5 mg/dL (ref 0.0–1.2)
Bilirubin, Direct: 0.15 mg/dL (ref 0.00–0.40)
Total Protein: 6.9 g/dL (ref 6.0–8.5)

## 2019-06-27 NOTE — Progress Notes (Signed)
Subjective:   Patient ID: Andre Dean, male   DOB: 43 y.o.   MRN: [id number]   HPI 43 year old male presents the office today for concerns of toenail fungus.  He states is been ongoing for a long time.  Couple years ago he did see his primary care physician he had blood work done he never actually started the treatment for nail fungus.  He is interested in starting this time.  His nails are thick and discolored.  He denies any pain in the nails and no redness or drainage.  He also states that he has a flatfoot and he has chronic foot pain mostly to the arch of his foot as well as "all over" as he is on his feet a lot for work.  No recent injury or falls.  No swelling or redness.   Review of Systems  All other systems reviewed and are negative.  Past Medical History:  Diagnosis Date  . Asthma   . Hypertension   . Sinus congestion     History reviewed. No pertinent surgical history.   Current Outpatient Medications:  .  acetaminophen (TYLENOL) 500 MG tablet, Take 1,000 mg by mouth every 6 (six) hours as needed for mild pain., Disp: , Rfl:  .  albuterol (PROAIR HFA) 108 (90 Base) MCG/ACT inhaler, Inhale 1-2 puffs into the lungs every 4 (four) hours as needed for wheezing. , Disp: , Rfl:  .  Black Elderberry (SAMBUCUS ELDERBERRY PO), Take 1 tablet by mouth 3 (three) times daily., Disp: , Rfl:  .  cloNIDine (CATAPRES) 0.1 MG tablet, Take 0.1 mg by mouth 2 (two) times daily., Disp: , Rfl:  .  hydrochlorothiazide (HYDRODIURIL) 25 MG tablet, Take 25 mg by mouth daily., Disp: , Rfl:  .  lisinopril (PRINIVIL,ZESTRIL) 40 MG tablet, Take 40 mg by mouth daily., Disp: , Rfl:  .  meloxicam (MOBIC) 15 MG tablet, Take 1 tablet (15 mg total) by mouth daily., Disp: 30 tablet, Rfl: 0  No Known Allergies        Objective:  Physical Exam  General: AAO x3, NAD  Dermatological: Nails are hypertrophic, dystrophic, brittle, discolored, elongated 10. No surrounding redness or drainage. No open  lesions or pre-ulcerative lesions are identified today.  Vascular: Dorsalis Pedis artery and Posterior Tibial artery pedal pulses are 2/4 bilateral with immedate capillary fill time. Pedal hair growth present. No varicosities and no lower extremity edema present bilateral. There is no pain with calf compression, swelling, warmth, erythema.   Neruologic: Grossly intact via light touch bilateral. Vibratory intact via tuning fork bilateral. Protective threshold with Semmes Wienstein monofilament intact to all pedal sites bilateral. Patellar and Achilles deep tendon reflexes 2+ bilateral. No Babinski or clonus noted bilateral.   Musculoskeletal: There is tenderness on the medial band plantar fascia in the arch of the foot bilaterally.  There is no specific area of pinpoint tenderness.  Significant flatfoot deformities present.  There is also certainly tenderness on the course of the posterior tibial tendon as well as the peroneal tendons.  Flexor, extensor tendons appear to be intact.  Ankle, subtalar joint range of motion intact.  Equinus is present.  Muscular strength 5/5 in all groups tested bilateral.  Gait: Unassisted, Nonantalgic.      Assessment:   Onychomycosis with bilateral plantar fasciitis, tendinitis due to flatfoot deformity    Plan:  -Treatment options discussed including all alternatives, risks, and complications -Etiology of symptoms were discussed -X-rays were obtained and reviewed with the patient.  Flatfoot  is present.  Mild arthritic changes present.  No evidence of acute fracture. -Regards to his foot pain we discussed stretching, icing daily. Prescribed mobic. Discussed side effects of the medication and directed to stop if any are to occur and call the office.  He would benefit from orthotics he is interested in this.  He was measured today for orthotics. -We discussed treatment options for nail fungus.  He wants to proceed with both topical and oral treatment.  I will check a  CBC and LFT.  Once this comes back I will order the medication.  [facility]  Directed on use of each of the medications.  Vivi BarrackMatthew R Arian Murley DPM

## 2019-06-29 ENCOUNTER — Other Ambulatory Visit: Payer: Self-pay | Admitting: Podiatry

## 2019-06-29 MED ORDER — TERBINAFINE HCL 250 MG PO TABS: 250.0000 mg | ORAL_TABLET | Freq: Every day | ORAL | 0 refills | Status: DC

## 2019-06-29 NOTE — Progress Notes (Signed)
Called and spoke with the patient and relayed the message per Dr Wagoner. Nancy Arvin 

## 2019-07-05 DIAGNOSIS — J4540 Moderate persistent asthma, uncomplicated: Secondary | ICD-10-CM | POA: Diagnosis not present

## 2019-07-08 ENCOUNTER — Other Ambulatory Visit: Payer: BC Managed Care – PPO | Admitting: Orthotics

## 2019-07-08 ENCOUNTER — Other Ambulatory Visit: Payer: Self-pay

## 2019-07-08 ENCOUNTER — Ambulatory Visit: Payer: BC Managed Care – PPO | Admitting: Orthotics

## 2019-07-08 ENCOUNTER — Telehealth: Payer: Self-pay | Admitting: Podiatry

## 2019-07-08 DIAGNOSIS — M779 Enthesopathy, unspecified: Secondary | ICD-10-CM

## 2019-07-08 NOTE — Telephone Encounter (Signed)
Pt called stating he has not been able to get his prescriptions from his pharmacy because they are waiting on prior authorization. Pt calling to follow up. Please give patient a call.

## 2019-07-08 NOTE — Progress Notes (Signed)
Patient f/o not here, will be here next week, rescheduled.

## 2019-07-09 NOTE — Telephone Encounter (Signed)
Spoke with the patient and stated that the prior authorization was being done and JQ started it today and we would call the patient early next week . Andre Dean

## 2019-07-15 ENCOUNTER — Encounter: Payer: BC Managed Care – PPO | Admitting: Orthotics

## 2019-07-19 ENCOUNTER — Telehealth: Payer: Self-pay | Admitting: Podiatry

## 2019-07-19 ENCOUNTER — Other Ambulatory Visit: Payer: Self-pay

## 2019-07-19 ENCOUNTER — Ambulatory Visit: Payer: BC Managed Care – PPO | Admitting: Orthotics

## 2019-07-19 DIAGNOSIS — M722 Plantar fascial fibromatosis: Secondary | ICD-10-CM

## 2019-07-19 DIAGNOSIS — M2141 Flat foot [pes planus] (acquired), right foot: Secondary | ICD-10-CM

## 2019-07-19 DIAGNOSIS — M779 Enthesopathy, unspecified: Secondary | ICD-10-CM

## 2019-07-19 DIAGNOSIS — M2142 Flat foot [pes planus] (acquired), left foot: Secondary | ICD-10-CM

## 2019-07-19 DIAGNOSIS — M6788 Other specified disorders of synovium and tendon, other site: Secondary | ICD-10-CM

## 2019-07-19 NOTE — Telephone Encounter (Signed)
Per Dr Jacqualyn Posey sent [name] from the [facility] and called the patient. Andre Dean

## 2019-07-19 NOTE — Telephone Encounter (Signed)
Pt left 2 messages Friday and I returned call and he is coming in today to [place].

## 2019-07-19 NOTE — Telephone Encounter (Signed)
Patient stated that he was coming to the office to see dawn and I stated that I would ask Dr Jacqualyn Posey about the doctor that per the patient did not take a sample of the toenails what the next step should be. Lattie Haw

## 2019-07-19 NOTE — Progress Notes (Signed)
Patient came in today to pick up custom made foot orthotics.  The goals were accomplished and the patient reported no dissatisfaction with said orthotics.  Patient was advised of breakin period and how to report any issues. 

## 2019-07-22 ENCOUNTER — Other Ambulatory Visit: Payer: Self-pay | Admitting: Podiatry

## 2019-07-23 ENCOUNTER — Ambulatory Visit (INDEPENDENT_AMBULATORY_CARE_PROVIDER_SITE_OTHER): Payer: BC Managed Care – PPO | Admitting: Podiatry

## 2019-07-23 ENCOUNTER — Encounter: Payer: Self-pay | Admitting: Podiatry

## 2019-07-23 ENCOUNTER — Other Ambulatory Visit: Payer: Self-pay

## 2019-07-23 VITALS — Temp 98.1°F

## 2019-07-23 DIAGNOSIS — B351 Tinea unguium: Secondary | ICD-10-CM | POA: Diagnosis not present

## 2019-07-23 DIAGNOSIS — M722 Plantar fascial fibromatosis: Secondary | ICD-10-CM | POA: Diagnosis not present

## 2019-07-23 MED ORDER — METHYLPREDNISOLONE 4 MG PO TBPK: ORAL_TABLET | ORAL | 0 refills | Status: DC

## 2019-07-23 NOTE — Patient Instructions (Signed)
For instructions on how to put on your Night Splint, please visit [email or web address]  Hold off on the meloxicam while on the steroids that I sent to your pharmacy.    Plantar Fasciitis (Heel Spur Syndrome) with Rehab The plantar fascia is a fibrous, ligament-like, soft-tissue structure that spans the bottom of the foot. Plantar fasciitis is a condition that causes pain in the foot due to inflammation of the tissue. SYMPTOMS   Pain and tenderness on the underneath side of the foot.  Pain that worsens with standing or walking. CAUSES  Plantar fasciitis is caused by irritation and injury to the plantar fascia on the underneath side of the foot. Common mechanisms of injury include:  Direct trauma to bottom of the foot.  Damage to a small nerve that runs under the foot where the main fascia attaches to the heel bone.  Stress placed on the plantar fascia due to bone spurs. RISK INCREASES WITH:   Activities that place stress on the plantar fascia (running, jumping, pivoting, or cutting).  Poor strength and flexibility.  Improperly fitted shoes.  Tight calf muscles.  Flat feet.  Failure to warm-up properly before activity.  Obesity. PREVENTION  Warm up and stretch properly before activity.  Allow for adequate recovery between workouts.  Maintain physical fitness:  Strength, flexibility, and endurance.  Cardiovascular fitness.  Maintain a health body weight.  Avoid stress on the plantar fascia.  Wear properly fitted shoes, including arch supports for individuals who have flat feet.  PROGNOSIS  If treated properly, then the symptoms of plantar fasciitis usually resolve without surgery. However, occasionally surgery is necessary.  RELATED COMPLICATIONS   Recurrent symptoms that may result in a chronic condition.  Problems of the lower back that are caused by compensating for the injury, such as limping.  Pain or weakness of the foot during push-off following  surgery.  Chronic inflammation, scarring, and partial or complete fascia tear, occurring more often from repeated injections.  TREATMENT  Treatment initially involves the use of ice and medication to help reduce pain and inflammation. The use of strengthening and stretching exercises may help reduce pain with activity, especially stretches of the Achilles tendon. These exercises may be performed at home or with a therapist. Your caregiver may recommend that you use heel cups of arch supports to help reduce stress on the plantar fascia. Occasionally, corticosteroid injections are given to reduce inflammation. If symptoms persist for greater than 6 months despite non-surgical (conservative), then surgery may be recommended.   MEDICATION   If pain medication is necessary, then nonsteroidal anti-inflammatory medications, such as aspirin and ibuprofen, or other minor pain relievers, such as acetaminophen, are often recommended.  Do not take pain medication within 7 days before surgery.  Prescription pain relievers may be given if deemed necessary by your caregiver. Use only as directed and only as much as you need.  Corticosteroid injections may be given by your caregiver. These injections should be reserved for the most serious cases, because they may only be given a certain number of times.  HEAT AND COLD  Cold treatment (icing) relieves pain and reduces inflammation. Cold treatment should be applied for 10 to 15 minutes every 2 to 3 hours for inflammation and pain and immediately after any activity that aggravates your symptoms. Use ice packs or massage the area with a piece of ice (ice massage).  Heat treatment may be used prior to performing the stretching and strengthening activities prescribed by your caregiver, physical therapist, or athletic  trainer. Use a heat pack or soak the injury in warm water.  SEEK IMMEDIATE MEDICAL CARE IF:  Treatment seems to offer no benefit, or the condition  worsens.  Any medications produce adverse side effects.  EXERCISES- RANGE OF MOTION (ROM) AND STRETCHING EXERCISES - Plantar Fasciitis (Heel Spur Syndrome) These exercises may help you when beginning to rehabilitate your injury. Your symptoms may resolve with or without further involvement from your physician, physical therapist or athletic trainer. While completing these exercises, remember:   Restoring tissue flexibility helps normal motion to return to the joints. This allows healthier, less painful movement and activity.  An effective stretch should be held for at least 30 seconds.  A stretch should never be painful. You should only feel a gentle lengthening or release in the stretched tissue.  RANGE OF MOTION - Toe Extension, Flexion  Sit with your right / left leg crossed over your opposite knee.  Grasp your toes and gently pull them back toward the top of your foot. You should feel a stretch on the bottom of your toes and/or foot.  Hold this stretch for 10 seconds.  Now, gently pull your toes toward the bottom of your foot. You should feel a stretch on the top of your toes and or foot.  Hold this stretch for 10 seconds. Repeat  times. Complete this stretch 3 times per day.   RANGE OF MOTION - Ankle Dorsiflexion, Active Assisted  Remove shoes and sit on a chair that is preferably not on a carpeted surface.  Place right / left foot under knee. Extend your opposite leg for support.  Keeping your heel down, slide your right / left foot back toward the chair until you feel a stretch at your ankle or calf. If you do not feel a stretch, slide your bottom forward to the edge of the chair, while still keeping your heel down.  Hold this stretch for 10 seconds. Repeat 3 times. Complete this stretch 2 times per day.   STRETCH  Gastroc, Standing  Place hands on wall.  Extend right / left leg, keeping the front knee somewhat bent.  Slightly point your toes inward on your back  foot.  Keeping your right / left heel on the floor and your knee straight, shift your weight toward the wall, not allowing your back to arch.  You should feel a gentle stretch in the right / left calf. Hold this position for 10 seconds. Repeat 3 times. Complete this stretch 2 times per day.  STRETCH  Soleus, Standing  Place hands on wall.  Extend right / left leg, keeping the other knee somewhat bent.  Slightly point your toes inward on your back foot.  Keep your right / left heel on the floor, bend your back knee, and slightly shift your weight over the back leg so that you feel a gentle stretch deep in your back calf.  Hold this position for 10 seconds. Repeat 3 times. Complete this stretch 2 times per day.  STRETCH  Gastrocsoleus, Standing  Note: This exercise can place a lot of stress on your foot and ankle. Please complete this exercise only if specifically instructed by your caregiver.   Place the ball of your right / left foot on a step, keeping your other foot firmly on the same step.  Hold on to the wall or a rail for balance.  Slowly lift your other foot, allowing your body weight to press your heel down over the edge of the  step.  You should feel a stretch in your right / left calf.  Hold this position for 10 seconds.  Repeat this exercise with a slight bend in your right / left knee. Repeat 3 times. Complete this stretch 2 times per day.   STRENGTHENING EXERCISES - Plantar Fasciitis (Heel Spur Syndrome)  These exercises may help you when beginning to rehabilitate your injury. They may resolve your symptoms with or without further involvement from your physician, physical therapist or athletic trainer. While completing these exercises, remember:   Muscles can gain both the endurance and the strength needed for everyday activities through controlled exercises.  Complete these exercises as instructed by your physician, physical therapist or athletic trainer. Progress  the resistance and repetitions only as guided.  STRENGTH - Towel Curls  Sit in a chair positioned on a non-carpeted surface.  Place your foot on a towel, keeping your heel on the floor.  Pull the towel toward your heel by only curling your toes. Keep your heel on the floor. Repeat 3 times. Complete this exercise 2 times per day.  STRENGTH - Ankle Inversion  Secure one end of a rubber exercise band/tubing to a fixed object (table, pole). Loop the other end around your foot just before your toes.  Place your fists between your knees. This will focus your strengthening at your ankle.  Slowly, pull your big toe up and in, making sure the band/tubing is positioned to resist the entire motion.  Hold this position for 10 seconds.  Have your muscles resist the band/tubing as it slowly pulls your foot back to the starting position. Repeat 3 times. Complete this exercises 2 times per day.  Document Released: 12/16/2005 Document Revised: 03/09/2012 Document Reviewed: 03/30/2009 [facility] Patient Information 2014 [place], [place].

## 2019-07-26 DIAGNOSIS — B351 Tinea unguium: Secondary | ICD-10-CM | POA: Diagnosis not present

## 2019-07-26 DIAGNOSIS — L608 Other nail disorders: Secondary | ICD-10-CM | POA: Diagnosis not present

## 2019-07-28 ENCOUNTER — Telehealth: Payer: Self-pay | Admitting: *Deleted

## 2019-07-28 ENCOUNTER — Other Ambulatory Visit: Payer: Self-pay

## 2019-07-28 ENCOUNTER — Ambulatory Visit (HOSPITAL_BASED_OUTPATIENT_CLINIC_OR_DEPARTMENT_OTHER): Payer: BC Managed Care – PPO | Attending: Internal Medicine | Admitting: Internal Medicine

## 2019-07-28 DIAGNOSIS — G4733 Obstructive sleep apnea (adult) (pediatric): Secondary | ICD-10-CM

## 2019-07-28 DIAGNOSIS — R0683 Snoring: Secondary | ICD-10-CM | POA: Diagnosis not present

## 2019-07-28 DIAGNOSIS — G4710 Hypersomnia, unspecified: Secondary | ICD-10-CM

## 2019-07-28 DIAGNOSIS — R5383 Other fatigue: Secondary | ICD-10-CM

## 2019-07-28 NOTE — Telephone Encounter (Signed)
Per Dr Wagoner ok to refill the mobic. Kendall Arnell 

## 2019-07-28 NOTE — Progress Notes (Signed)
Subjective: 43 year old male presents the office today for follow-up evaluation of bilateral plantar fasciitis as well as for tinea pedis.  He did not get the medication due to insurance and the nail fungus.  He does have a nail culture performed.  He has states he is getting pain to his heels still.  Continue the anti-inflammatory as well as stretching and icing daily.  No recent injury or falls. Denies any systemic complaints such as fevers, chills, nausea, vomiting. No acute changes since last appointment, and no other complaints at this time.   Objective: AAO x3, NAD DP/PT pulses palpable bilaterally, CRT less than 3 seconds Protective sensation intact with Simms Weinstein monofilament Nails are unchanged Still tenderness palpation of plantar medial tubercle of the calcaneus at the insertion plantar fascia appears to be intact.  No pain with lateral compression of the calcaneus at the Achilles tendon.  No other areas of tenderness are elicited at this time. No open lesions or pre-ulcerative lesions.  No pain with calf compression, swelling, warmth, erythema  Assessment: Plantar fasciitis, onychomycosis  Plan: -All treatment options discussed with the patient including all alternatives, risks, complications.  -Steroid injection performed bilaterally.  See procedure note below.  Continue stretching, icing daily.  Discussed shoe modifications and orthotics -Nails were debrided today and sent for culture.  Specimen was given to [place] -Patient encouraged to call the office with any questions, concerns, change in symptoms.   Procedure: Injection Tendon/Ligament Discussed alternatives, risks, complications and verbal consent was obtained.  Location: Bilateral plantar fascia at the glabrous junction; medial approach. Skin Prep: Injectate: 0.5cc 0.5% marcaine plain, 0.5 cc 2% lidocaine plain and, 1 cc kenalog 10. Disposition: Patient tolerated procedure well. Injection site dressed with  a band-aid.  Post-injection care was discussed and return precautions discussed.   Trula Slade DPM

## 2019-07-30 DIAGNOSIS — G4733 Obstructive sleep apnea (adult) (pediatric): Secondary | ICD-10-CM | POA: Insufficient documentation

## 2019-08-01 DIAGNOSIS — R0683 Snoring: Secondary | ICD-10-CM

## 2019-08-01 NOTE — Procedures (Signed)
    Patient Name: Andre Dean, Fina Date: 07/28/2019 Gender: Male D.O.B: 03/24/1976 Age (years): 39 Referring Provider: Latanya Presser MD Height (inches): 54 Interpreting Physician: Baird Lyons MD, ABSM Weight (lbs): 285 RPSGT: Jacolyn Reedy BMI: 37 MRN: [id number] Neck Size: 17.00  CLINICAL INFORMATION Sleep Study Type: HST Indication for sleep study: Excessive Daytime Sleepiness, Fatigue, Snoring Epworth Sleepiness Score: 8  SLEEP STUDY TECHNIQUE A multi-channel overnight portable sleep study was performed. The channels recorded were: nasal airflow, thoracic respiratory movement, and oxygen saturation with a pulse oximetry. Snoring was also monitored.  MEDICATIONS Patient self administered medications include: none reported.  SLEEP ARCHITECTURE Patient was studied for 423.1 minutes. The sleep efficiency was 97.0 % and the patient was supine for 80.6%. The arousal index was 0.0 per hour.  RESPIRATORY PARAMETERS The overall AHI was 56.4 per hour, with a central apnea index of 0.0 per hour The oxygen nadir was 82% during sleep.  CARDIAC DATA Mean heart rate during sleep was 73.0 bpm.  IMPRESSIONS - Severe obstructive sleep apnea occurred during this study (AHI = 56.4/h). - No significant central sleep apnea occurred during this study (CAI = 0.0/h). - Moderate oxygen desaturation was noted during this study (Min O2 = 82%). Mean sat 96%. - Patient snored.  DIAGNOSIS - Obstructive Sleep Apnea (327.23 [G47.33 ICD-10])  RECOMMENDATIONS - Suggest CPAP titration sleepstudy or autopap. Other options would be based on clinical judgment. - Be careful with alcohol, sedatives and other CNS depressants that may worsen sleep apnea and disrupt normal sleep architecture. - Sleep hygiene should be reviewed to assess factors that may improve sleep quality. - Weight management and regular exercise should be initiated or continued.  [Electronically signed] 08/01/2019 01:57 PM   Baird Lyons MD, [place], American Board of Sleep Medicine   NPI: [id number]                         [place] [place] [facility]  ELECTRONICALLY SIGNED ON:  08/01/2019, 1:55 PM [place] PH: [phone]   FX: [phone] [place]

## 2019-08-11 ENCOUNTER — Telehealth: Payer: Self-pay | Admitting: *Deleted

## 2019-08-11 NOTE — Telephone Encounter (Signed)
Left message for pt to call for results  

## 2019-08-11 NOTE — Telephone Encounter (Signed)
-----   Message from Trula Slade, DPM sent at 08/10/2019  7:10 AM EDT ----- Tivis Ringer- We had previously ordered Lamisil however apparently insurance stated that he needed a culture done prior. The culture did not show fungus. However clinically it looks like toenail fungus. I don't think that insurance will approve Lamisil now but we can try topical. I had ordered Jublia but he states he never got it. Can you please re-order that? Also he could use urea for the nails and a biotin supplement. Thanks.

## 2019-08-12 MED ORDER — JUBLIA 10 % EX SOLN: 1.0000 | Freq: Every day | CUTANEOUS | 11 refills | Status: DC

## 2019-08-12 MED ORDER — JUBLIA 10 % EX SOLN: 1.0000 [drp] | Freq: Every day | CUTANEOUS | 11 refills | Status: DC

## 2019-08-12 NOTE — Addendum Note (Signed)
Addended by: Harriett Sine D on: 08/12/2019 10:24 AM   Modules accepted: Orders

## 2019-08-12 NOTE — Telephone Encounter (Signed)
I informed pt of Dr. Leigh Aurora review of results and orders. Pt states understanding and I informed that Jublia would be ordered [facility].

## 2019-08-26 ENCOUNTER — Ambulatory Visit: Payer: Self-pay | Admitting: Podiatry

## 2019-12-09 ENCOUNTER — Telehealth: Payer: Self-pay | Admitting: Podiatry

## 2019-12-09 NOTE — Telephone Encounter (Signed)
Left message informing pt he had received the therapeutic dose of #90 and would not be refilled until he was evaluated in 6-9 months to evaluate for healthy outgrowth, and if he needed a refill of the Jublia he would need to have a refill request and PA request sent from his pharmacy.

## 2019-12-09 NOTE — Telephone Encounter (Signed)
Pt stated that he has a new Set designer with [facility] and he needs prior auth for his nail fungus medication.

## 2020-01-24 ENCOUNTER — Other Ambulatory Visit: Payer: Self-pay | Admitting: Otolaryngology

## 2020-02-23 ENCOUNTER — Other Ambulatory Visit: Payer: Self-pay

## 2020-02-24 ENCOUNTER — Ambulatory Visit: Payer: BC Managed Care – PPO | Admitting: Internal Medicine

## 2020-02-24 ENCOUNTER — Encounter: Payer: Self-pay | Admitting: Internal Medicine

## 2020-02-24 VITALS — BP 130/98 | HR 92 | Ht 74.0 in | Wt 298.0 lb

## 2020-02-24 DIAGNOSIS — E269 Hyperaldosteronism, unspecified: Secondary | ICD-10-CM | POA: Diagnosis not present

## 2020-02-24 MED ORDER — DOXAZOSIN MESYLATE 4 MG PO TABS: 4.0000 mg | ORAL_TABLET | Freq: Every day | ORAL | 1 refills | Status: DC

## 2020-02-24 NOTE — Patient Instructions (Addendum)
Please continue:  Hydralazine 100 mg 3x a day  HCTZ 25 mg daily  Amlodipine 10 mg daily   Stop: - Lisinopril now  Stop: - Clonidine 3 days before the next tests  Start: - Doxazosin 4 mg daily  Please come back for labs in 2 weeks after starting Doxazosin.  Take 2 potassium pills the morning of the labs.  You can try to take magnesium 400-500 mg at night.  Please come back for a follow-up appointment in 3 months.

## 2020-02-24 NOTE — Progress Notes (Addendum)
Patient ID: Andre Dean, male   DOB: Aug 07, 1976, 44 y.o.   MRN: [id number]   This visit occurred during the SARS-CoV-2 public health emergency.  Safety protocols were in place, including screening questions prior to the visit, additional usage of staff PPE, and extensive cleaning of exam room while observing appropriate contact time as indicated for disinfecting solutions.   HPI  Andre Dean is a 44 y.o.-year-old male, referred by his PCP, Dr. Corky Downs, for evaluation for hyperaldosteronism in the setting of uncontrolled hypertension.  Patient describes that he was diagnosed with hypertension in his 72s.  Reviewed previous blood pressure levels: 01/21/2020: 192/121 BP Readings from Last 3 Encounters:  09/05/18 (!) 165/115  03/05/18 (!) 160/109   He is not checking his blood pressures at home.   He is on the following antihypertensive regimen:  Lisinopril 40 mg daily  Clonidine 0.2 mg 2x a day: ~4:30 to 5 PM when he arrives home from work, and at bedtime.  He cannot take this in the morning because of fatigue.  Hydralazine 100 mg 3x a day  HCTZ 25 mg daily  Amlodipine 10 mg daily - added 12/2018  She also has a history of low potassium levels and was started on potassium 10 mEq daily.  Reviewed previous potassium levels: 02/22/2020: potassium 3.6 (3.5-5.3) Lab Results  Component Value Date   K 3.4 (L) 09/04/2018   K 3.2 (L) 03/05/2018   He denies hypertensive spells, headaches, palpitations, CP, blurry vision.  Of note, he was tested for pheochromocytoma and investigation was negative: 01/11/2019: Plasma metanephrines 38 (0-62), plasma normetanephrine 75 (0-175)  He was also tested for Cushing's syndrome: 01/11/2019: 24-hour urine cortisol 15 (5-64)  However, he had an increased aldosterone/PRA level: 02/22/2020: Aldosterone 5 (0-30), PRA 0.62, aldosterone/renin ratio 8.1 01/11/2019: Aldosterone 13.5 (0-30), PRA <0.167, aldosterone/renin ratio >81.4  No history of hypo or  hyperthyroidism.  TSH levels were normal: 01/11/2019: TSH 3.54 No results found for: TSH   No diabetes or prediabetes: No results found for: HGBA1C   No hyperparathyroidism or hypercalcemia: No results found for: PTH  Lab Results  Component Value Date   CALCIUM 9.8 09/04/2018   CALCIUM 8.5 (L) 03/05/2018   Pt. denies use of stimulants. Denies street drug use.   Denies licorice use. Not on steroids now. Was on Prednisone 2 months ago. On Xolair for asthma. No NSAIDs.  Of note, he had a normal pharmaceutical stress test on 08/22/2016.  He has a history of OSA.  Will have turbinate surgery in 02/2020.  She has an extensive family history of uncontrolled hypertension in M, MGM.   ROS: Constitutional: no Weight gain, + fatigue (mostly after he takes his blood pressure medications), no subjective hyperthermia/hypothermia, + nocturia Eyes: no blurry vision, no xerophthalmia ENT: no sore throat, no nodules palpated in throat, no dysphagia/odynophagia, no hoarseness Cardiovascular: no CP/+ SOB/no palpitations/leg swelling Respiratory: no cough/+ SOB/+ wheezing Gastrointestinal: no N/V/D/C Musculoskeletal: no muscle/joint aches Skin: no rashes Neurological: no tremors/numbness/tingling/dizziness Psychiatric: no depression/anxiety + Low libido  Past Medical History:  Diagnosis Date  . Asthma   . Hypertension   . Sinus congestion    No past surgical history on file. Social History   Socioeconomic History  . Marital status: Single    Spouse name: Not on file  . Number of children: 1: 62 y/o in 01/2020  . Years of education: Not on file  . Highest education level: Not on file  Occupational History  . Not on file  Tobacco  Use  . Smoking status: Current Every Day Smoker    Packs/day: 1/2    Types: Cigarettes  . Smokeless tobacco: Never Used  Substance and Sexual Activity  . Alcohol use: Yes    Comment: occasional, 2 drinks a week  . Drug use: No  . Sexual activity: Not  on file  Other Topics Concern  . Not on file  Social History Narrative  . Not on file   Social Determinants of Health   Financial Resource Strain:   . Difficulty of Paying Living Expenses: Not on file  Food Insecurity:   . Worried About Programme researcher, broadcasting/film/video in the Last Year: Not on file  . Ran Out of Food in the Last Year: Not on file  Transportation Needs:   . Lack of Transportation (Medical): Not on file  . Lack of Transportation (Non-Medical): Not on file  Physical Activity:   . Days of Exercise per Week: Not on file  . Minutes of Exercise per Session: Not on file  Stress:   . Feeling of Stress : Not on file  Social Connections:   . Frequency of Communication with Friends and Family: Not on file  . Frequency of Social Gatherings with Friends and Family: Not on file  . Attends Religious Services: Not on file  . Active Member of Clubs or Organizations: Not on file  . Attends Banker Meetings: Not on file  . Marital Status: Not on file  Intimate Partner Violence:   . Fear of Current or Ex-Partner: Not on file  . Emotionally Abused: Not on file  . Physically Abused: Not on file  . Sexually Abused: Not on file   Current Outpatient Medications on File Prior to Visit  Medication Sig Dispense Refill  . acetaminophen (TYLENOL) 500 MG tablet Take 1,000 mg by mouth every 6 (six) hours as needed for mild pain.    Marland Kitchen albuterol (PROAIR HFA) 108 (90 Base) MCG/ACT inhaler Inhale 1-2 puffs into the lungs every 4 (four) hours as needed for wheezing.     Marland Kitchen azelastine (ASTELIN) 0.1 % nasal spray     . Black Elderberry (SAMBUCUS ELDERBERRY PO) Take 1 tablet by mouth 3 (three) times daily.    . cloNIDine (CATAPRES) 0.1 MG tablet Take 0.1 mg by mouth 2 (two) times daily.    . cloNIDine (CATAPRES) 0.2 MG tablet Take 0.2 mg by mouth 3 (three) times daily.    . Efinaconazole (JUBLIA) 10 % SOLN Apply 1 drop topically daily. 8 mL 11  . fluticasone (FLONASE) 50 MCG/ACT nasal spray     .  hydrochlorothiazide (HYDRODIURIL) 25 MG tablet Take 25 mg by mouth daily.    Marland Kitchen levocetirizine (XYZAL) 5 MG tablet     . lisinopril (PRINIVIL,ZESTRIL) 40 MG tablet Take 40 mg by mouth daily.    . meloxicam (MOBIC) 15 MG tablet TAKE 1 TABLET BY MOUTH EVERY DAY 30 tablet 0  . methylPREDNISolone (MEDROL DOSEPAK) 4 MG TBPK tablet Take as directed 21 tablet 0  . potassium chloride (MICRO-K) 10 MEQ CR capsule Take by mouth daily.    Marland Kitchen terbinafine (LAMISIL) 250 MG tablet Take 1 tablet (250 mg total) by mouth daily. 90 tablet 0   No current facility-administered medications on file prior to visit.   No Known Allergies No family history on file.   PE: BP (!) 130/98   Pulse 92   Ht 6\' 2"  (1.88 m)   Wt 298 lb (135.2 kg)   SpO2 98%  BMI 38.26 kg/m  Wt Readings from Last 3 Encounters:  02/24/20 298 lb (135.2 kg)  07/28/19 285 lb (129.3 kg)  09/04/18 270 lb (122.5 kg)   Constitutional: overweight, in NAD Eyes: PERRLA, EOMI ENT: moist mucous membranes, no thyromegaly,  no cervical lymphadenopathy Cardiovascular: RRR, No MRG Respiratory: CTA B Gastrointestinal: abdomen soft, NT, ND, BS+ Musculoskeletal: no deformities, strength intact in all 4 Skin: moist, warm, no rashes Neurological: no tremor with outstretched hands, DTR normal in all 4  ASSESSMENT: 1.  Hyperaldosteronism  PLAN: 1.   Hyperaldosteronism -Patient with longstanding uncontrolled hypertension, on 5 different antihypertensives.  At today's visit, blood pressure was best he had in a while, at 130/98. -We discussed that possible endocrine causes for his uncontrolled hypertension can be:  Primary hyperaldosteronism  Hypo or hyperthyroidism -normal TSH  Cushing's syndrome -no signs or symptoms of excess cortisol production, also, he had a normal 24-hour urine cortisol  Pheochromocytoma -no hypertensive spells, HAs, also, he had normal plasma metanephrines  Primary hyperparathyroidism -no history of elevated calcium -he  had an investigation for primary hyperaldosteronism x2:  In 12/2018, his aldosterone level was normal, but above 10 and the PRA was suppressed, with an aldosterone/renin ratio undetectably high.  This pointed towards primary hyperparathyroidism.  However, I am not sure if his potassium level was normal or low at that time.  He is currently taking potassium supplements.  In 12/2019, his aldosterone level was low, at 5, and PRA was lower than 1, but detectable.  His aldosterone/renin ratio was normal, at 8.1. -At this visit, we discussed that his antihypertensive medications may be affecting some of these tests.  On his list, the problem medications are the central alpha-2 receptor agonist (clonidine) and the ACE inhibitor.  These can artificially decrease the aldosterone level.  Clonidine can decrease renin while ACE inhibitor can increase it.  Therefore, I advised him to come off both of these medicines and replace them with medications that would not interfere with testing.  I advised him to start doxazosin 4 mg daily.  I advised him to first stop lisinopril and only 3 days prior to coming for labs to stop clonidine, since this has a short half-life, and will likely be out of his system in 3 days. -If the results again point towards primary hyperaldosteronism, we will need to do a confirmatory test.  I would recommend a home-performed salt suppression test.  We discussed about how to perform the test: Take 2 tablets of 1 g of salt with each meal (3 times a day) for 3 days and then perform a 24-hour urine collection in the third day.  When he brings this to the office, we will check this for sodium, aldosterone, creatinine.  For this test, he will need to be off the ACE inhibitor, and ideally also his diuretic, for at least 2 weeks (however, this may not be possible since I definitely would like to avoid hypertensive urgency).  After the saline suppression test, the 24-hour urine aldosterone should be <10  normally, but this is usually >12 if he has primary hyperaldosteronism.  Sodium needs to be >200 to confirm the efficacy of the salt load. -We did discuss that if he does primary hyperaldosteronism, this can be unilateral or bilateral.  We will need to check a CT scan and possibly even an adrenal venous sampling test.  1. If pt has unilateral adrenal hyperaldosteronism (Conn syndrome), he would benefit from surgery 2. If he has bilateral adrenal hyperaldosteronism, he  would benefit from spironolactone. - He does agree with surgery if needed.  He would love to come off at least some some of his blood pressure medicines. - We will keep in touch and discuss about the results as soon as they return  - I will see him back in 3 months   Orders Placed This Encounter  Procedures  . Potassium  . Aldosterone + renin activity w/ ratio   Component     Latest Ref Rng & Units 03/15/2020  ALDOSTERONE     * ng/dL 11  Renin Activity     0.25 - 5.82 ng/mL/h 0.33  ALDO / PRA Ratio     0.9 - 28.9 Ratio 33.3 (H)  Potassium     3.5 - 5.1 mEq/L 3.4 (L)   Component     Latest Ref Rng & Units 04/17/2020  Sodium/Creat Ratio     30 - 180 mmol/g creat 108  Sodium, 24H Ur     52 - 380 mmol/24 h 333  Creatinine, 24H Ur     0.50 - 2.15 g/24 h 3.08 (H)  Volume, Urine-ALDU     mL 2,850  Aldosterone, 24H Ur     mcg/24 h 14.5  Creatinine, Urine mg/day-VMAUR     0.50 - 2.15 g/24 h 3.06 (H)   24-hour urine aldosterone higher than 12 mcg per 24 hours in the setting of an appropriate elevated urinary sodium, higher than 200 mmol per 24 hours.  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  Will refer him for an adrenal CT scan now.  Philemon Kingdom, MD PhD [facility]

## 2020-03-02 ENCOUNTER — Encounter: Payer: Self-pay | Admitting: Internal Medicine

## 2020-03-08 ENCOUNTER — Encounter: Payer: Self-pay | Admitting: Internal Medicine

## 2020-03-08 ENCOUNTER — Encounter: Payer: Self-pay | Admitting: Podiatry

## 2020-03-08 NOTE — Telephone Encounter (Signed)
Called and spoke with Faith from [place] and I started a prior authorization on Jubl[employer] and the case number # [id number] and it will take 5 business days to see if the jublia is approved and I have called the patient as well. Misty Stanley

## 2020-03-10 ENCOUNTER — Encounter: Payer: Self-pay | Admitting: Internal Medicine

## 2020-03-13 ENCOUNTER — Encounter: Payer: Self-pay | Admitting: Internal Medicine

## 2020-03-15 ENCOUNTER — Other Ambulatory Visit (INDEPENDENT_AMBULATORY_CARE_PROVIDER_SITE_OTHER): Payer: Managed Care, Other (non HMO)

## 2020-03-15 ENCOUNTER — Other Ambulatory Visit: Payer: Self-pay

## 2020-03-15 DIAGNOSIS — E269 Hyperaldosteronism, unspecified: Secondary | ICD-10-CM | POA: Diagnosis not present

## 2020-03-16 LAB — POTASSIUM: Potassium: 3.4 mEq/L — ABNORMAL LOW (ref 3.5–5.1)

## 2020-03-17 ENCOUNTER — Other Ambulatory Visit: Payer: Self-pay | Admitting: Internal Medicine

## 2020-03-22 LAB — ALDOSTERONE + RENIN ACTIVITY W/ RATIO
ALDO / PRA Ratio: 33.3 Ratio — ABNORMAL HIGH (ref 0.9–28.9)
Aldosterone: 11 ng/dL
Renin Activity: 0.33 ng/mL/h (ref 0.25–5.82)

## 2020-03-23 ENCOUNTER — Encounter: Payer: Self-pay | Admitting: Internal Medicine

## 2020-03-23 ENCOUNTER — Other Ambulatory Visit: Payer: Self-pay | Admitting: Internal Medicine

## 2020-03-23 DIAGNOSIS — E269 Hyperaldosteronism, unspecified: Secondary | ICD-10-CM

## 2020-03-23 MED ORDER — SODIUM CHLORIDE 1 G PO TABS: 2.0000 g | ORAL_TABLET | Freq: Three times a day (TID) | ORAL | 0 refills | Status: DC

## 2020-03-24 ENCOUNTER — Encounter: Payer: Self-pay | Admitting: Internal Medicine

## 2020-03-24 ENCOUNTER — Ambulatory Visit (HOSPITAL_BASED_OUTPATIENT_CLINIC_OR_DEPARTMENT_OTHER): Admit: 2020-03-24 | Payer: Managed Care, Other (non HMO) | Admitting: Otolaryngology

## 2020-03-24 ENCOUNTER — Encounter (HOSPITAL_BASED_OUTPATIENT_CLINIC_OR_DEPARTMENT_OTHER): Payer: Self-pay

## 2020-03-24 SURGERY — REDUCTION, NASAL TURBINATE
Anesthesia: General | Laterality: Bilateral

## 2020-04-04 ENCOUNTER — Other Ambulatory Visit: Payer: Self-pay

## 2020-04-04 ENCOUNTER — Other Ambulatory Visit: Payer: Self-pay | Admitting: Internal Medicine

## 2020-04-04 DIAGNOSIS — E269 Hyperaldosteronism, unspecified: Secondary | ICD-10-CM

## 2020-04-04 NOTE — Addendum Note (Signed)
Addended by: Adline Mango I on: 04/04/2020 01:29 PM   Modules accepted: Orders

## 2020-04-10 ENCOUNTER — Encounter: Payer: Self-pay | Admitting: Internal Medicine

## 2020-04-17 ENCOUNTER — Other Ambulatory Visit: Payer: Self-pay

## 2020-04-17 ENCOUNTER — Other Ambulatory Visit (INDEPENDENT_AMBULATORY_CARE_PROVIDER_SITE_OTHER): Payer: Managed Care, Other (non HMO)

## 2020-04-17 DIAGNOSIS — E269 Hyperaldosteronism, unspecified: Secondary | ICD-10-CM

## 2020-04-18 ENCOUNTER — Encounter: Payer: Self-pay | Admitting: Internal Medicine

## 2020-04-21 ENCOUNTER — Other Ambulatory Visit: Payer: Self-pay

## 2020-04-21 MED ORDER — DOXAZOSIN MESYLATE 4 MG PO TABS: 4.0000 mg | ORAL_TABLET | Freq: Every day | ORAL | 1 refills | Status: DC

## 2020-04-22 LAB — ALDOSTERONE, URINE, 24 HOUR
Aldosterone, 24H Ur: 14.5 mcg/24 h
Creatinine, Urine mg/day-VMAUR: 3.06 g/(24.h) — ABNORMAL HIGH (ref 0.50–2.15)
Volume, Urine-ALDU: 2850 mL

## 2020-04-22 LAB — SODIUM,24 HOUR URINE WITH CREATININE
Creatinine, 24H Ur: 3.08 g/(24.h) — ABNORMAL HIGH (ref 0.50–2.15)
Sodium, 24H Ur: 333 mmol/24 h (ref 52–380)
Sodium/Creat Ratio: 108 mmol/g creat (ref 30–180)

## 2020-04-24 NOTE — Addendum Note (Signed)
Addended by: Carlus Pavlov on: 04/24/2020 10:32 AM   Modules accepted: Orders

## 2020-04-25 ENCOUNTER — Other Ambulatory Visit: Payer: Self-pay | Admitting: Podiatry

## 2020-04-26 ENCOUNTER — Other Ambulatory Visit: Payer: Self-pay

## 2020-05-01 ENCOUNTER — Telehealth: Payer: Self-pay | Admitting: *Deleted

## 2020-05-05 ENCOUNTER — Telehealth: Payer: Self-pay | Admitting: *Deleted

## 2020-05-05 NOTE — Telephone Encounter (Signed)
Error

## 2020-05-05 NOTE — Telephone Encounter (Signed)
[facility] request call concerning pt's prescription Ref: [id number].

## 2020-05-05 NOTE — Telephone Encounter (Signed)
Called and spoke with the patient about doing a virtual to discuss the lamisil tablets and to get some blood work done and the patient stated that he would be off on the 28th of May. Misty Stanley

## 2020-05-08 NOTE — Telephone Encounter (Signed)
I do not think I am available on the 28th of May but I can see him when it is convenient for him.

## 2020-05-09 ENCOUNTER — Ambulatory Visit
Admission: RE | Admit: 2020-05-09 | Discharge: 2020-05-09 | Disposition: A | Discharge status: Home / Self Care | Payer: Managed Care, Other (non HMO) | Source: Ambulatory Visit | Attending: Internal Medicine | Admitting: Internal Medicine

## 2020-05-09 DIAGNOSIS — E269 Hyperaldosteronism, unspecified: Secondary | ICD-10-CM

## 2020-05-09 MED ORDER — IOPAMIDOL (ISOVUE-300) INJECTION 61%
100.0000 mL | Freq: Once | INTRAVENOUS | Status: AC | PRN
  Administered 2020-05-09: 100 mL via INTRAVENOUS

## 2020-05-11 ENCOUNTER — Other Ambulatory Visit: Payer: Self-pay | Admitting: Internal Medicine

## 2020-05-11 DIAGNOSIS — E269 Hyperaldosteronism, unspecified: Secondary | ICD-10-CM

## 2020-05-11 MED ORDER — DEXAMETHASONE 1 MG PO TABS
ORAL_TABLET | ORAL | 0 refills | Status: DC
Start: 2020-05-11 — End: 2020-08-18

## 2020-05-16 ENCOUNTER — Telehealth: Payer: Self-pay | Admitting: Podiatry

## 2020-05-16 NOTE — Telephone Encounter (Signed)
Called lvm for pt to call and sched a virtual visit w/Dr.Wagoner

## 2020-05-21 ENCOUNTER — Encounter: Payer: Self-pay | Admitting: Internal Medicine

## 2020-05-24 ENCOUNTER — Other Ambulatory Visit (INDEPENDENT_AMBULATORY_CARE_PROVIDER_SITE_OTHER): Payer: Managed Care, Other (non HMO)

## 2020-05-24 ENCOUNTER — Other Ambulatory Visit: Payer: Self-pay

## 2020-05-24 DIAGNOSIS — E269 Hyperaldosteronism, unspecified: Secondary | ICD-10-CM

## 2020-05-25 ENCOUNTER — Encounter: Payer: Self-pay | Admitting: Internal Medicine

## 2020-05-26 ENCOUNTER — Other Ambulatory Visit: Payer: Self-pay

## 2020-05-26 ENCOUNTER — Ambulatory Visit: Payer: Managed Care, Other (non HMO) | Admitting: Internal Medicine

## 2020-05-26 ENCOUNTER — Encounter: Payer: Self-pay | Admitting: Internal Medicine

## 2020-05-26 VITALS — BP 120/86 | HR 102 | Ht 74.0 in | Wt 303.0 lb

## 2020-05-26 DIAGNOSIS — E269 Hyperaldosteronism, unspecified: Secondary | ICD-10-CM | POA: Diagnosis not present

## 2020-05-26 DIAGNOSIS — I152 Hypertension secondary to endocrine disorders: Secondary | ICD-10-CM

## 2020-05-26 MED ORDER — HYDRALAZINE HCL 100 MG PO TABS: 100.0000 mg | ORAL_TABLET | Freq: Two times a day (BID) | ORAL | 3 refills | Status: DC

## 2020-05-26 MED ORDER — HYDROCHLOROTHIAZIDE 25 MG PO TABS: 25.0000 mg | ORAL_TABLET | Freq: Every day | ORAL | 3 refills | Status: DC

## 2020-05-26 NOTE — Progress Notes (Addendum)
Patient ID: Andre Dean, male   DOB: 10/02/1976, 44 y.o.   MRN: [id number]   This visit occurred during the SARS-CoV-2 public health emergency.  Safety protocols were in place, including screening questions prior to the visit, additional usage of staff PPE, and extensive cleaning of exam room while observing appropriate contact time as indicated for disinfecting solutions.   HPI  Andre Dean is a 44 y.o.-year-old male, referred by his PCP, Dr. Corky Downs, for evaluation for hyperaldosteronism in the setting of uncontrolled hypertension.  Last visit 3 months ago.  Patient describes that he was diagnosed with hypertension in his 45s.  Reviewed previous blood pressure levels: BP Readings from Last 3 Encounters:  02/24/20 (!) 130/98  09/05/18 (!) 165/115  03/05/18 (!) 160/109  01/21/2020: 192/121  He does not check his blood pressure at home.   At last visit he was on the following antihypertensive regimen:  Lisinopril 40 mg daily  Clonidine 0.2 mg 2x a day: ~4:30 to 5 PM when he arrives home from work, and at bedtime.  He cannot take this in the morning because of fatigue.  Hydralazine 100 mg 3x a day  HCTZ 25 mg daily  Amlodipine 10 mg daily - added 12/2018  To be able to better investigate his hyperaldosteronism, we changed his regimen.  He is currently on:  Clonidine 0.2 mg 2x daily  HCTZ 25 mg daily  Cardura 4 mg daily  Hydralazine 100 mg 3x a day  Amlodipine 10 mg daily  Hydralazine causes exacerbation of allergies.  Clonidine causes significant fatigue.  He has a history of low potassium levels and was started on potassium 10 mEq daily.  He continues on this dose.  Reviewed previous potassium levels: Lab Results  Component Value Date   K 3.4 (L) 03/15/2020   K 3.4 (L) 09/04/2018   K 3.2 (L) 03/05/2018  02/22/2020: potassium 3.6 (3.5-5.3).  He denies hypotensive spells, headaches, palpitations, chest pain, blurry vision.  Investigation for pheochromocytoma was  negative: 01/11/2019: Plasma metanephrines 38 (0-62), plasma normetanephrine 75 (0-175)  She was also tested for Cushing syndrome and investigation was negative: 01/11/2019: 24-hour urine cortisol 15 (5-64)  However, he had an increase aldosterone/PRA level: 02/22/2020: Aldosterone 5 (0-30), PRA 0.62, aldosterone/renin ratio 8.1 01/11/2019: Aldosterone 13.5 (0-30), PRA <0.167, aldosterone/renin ratio >81.4  At last visit we rechecked he has aldosterone and plasma renin activity and the ratio was abnormal: Component     Latest Ref Rng & Units 03/15/2020  ALDOSTERONE     * ng/dL 11  Renin Activity     0.25 - 5.82 ng/mL/h 0.33  ALDO / PRA Ratio     0.9 - 28.9 Ratio 33.3 (H)  Potassium     3.5 - 5.1 mEq/L 3.4 (L)   We proceeded with confirmatory test: P.o. saline aldosterone suppression.  At the end of 3 days of p.o. salt tablets, his 24-hour urine aldosterone was higher than 12 mcg per 24 hours in the setting of inappropriately elevated urine sodium, higher than 200 mmol per 24 hours.  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 Component     Latest Ref Rng & Units 04/17/2020  Sodium/Creat Ratio     30 - 180 mmol/g creat 108  Sodium, 24H Ur     52 - 380 mmol/24 h 333  Creatinine, 24H Ur     0.50 - 2.15 g/24 h 3.08 (H)  Volume, Urine-ALDU     mL 2,850  Aldosterone, 24H Ur  mcg/24 h 14.5  Creatinine, Urine mg/day-VMAUR     0.50 - 2.15 g/24 h 3.06 (H)   I referred him for an adrenal CT scan (05/09/2020): Negative for adrenal masses: 1. No acute findings within the abdomen. No adrenal mass identified. 2. Small nonspecific pulmonary nodules are identified within the lung bases. No follow-up needed if patient is low-risk (and has no known or suspected primary neoplasm). Non-contrast chest CT can be considered in 12 months if patient is high-risk. This recommendation follows the consensus statement: Guidelines for Management of Incidental Pulmonary Nodules Detected  on CT Images: From the Fleischner Society 2017; Radiology 2017; 284:228-243.  Therefore, we proceeded to investigate him for glucocorticoid remediable hyperaldosteronism.  We checked a dexamethasone suppression test.  The results are not all back. Cortisol after dexamethasone returned low, confirming steroid suppression: Component     Latest Ref Rng & Units 05/24/2020  Cortisol - AM     mcg/dL 0.9 (L)   No history of hypo or hyperthyroidism: 01/11/2019: TSH 3.54 No results found for: TSH   No history of diabetes or prediabetes: No results found for: HGBA1C   No hyperparathyroidism or hypercalcemia: No results found for: PTH  Lab Results  Component Value Date   CALCIUM 9.8 09/04/2018   CALCIUM 8.5 (L) 03/05/2018   Denies use of stimulants. Denies street drug use.   Denies licorice use. Not on steroids. On Xolair for asthma. No NSAIDs.  Of note, he had a normal pharmaceutical stress test on 08/22/2016.  He has a history of OSA.  He had turbinate surgery in 02/2020.  He has extensive family history of uncontrolled hypertension in M, MGM.   ROS: Constitutional: no weight gain/no weight loss, + fatigue (mostly after he takes his blood pressure medicines), no subjective hyperthermia, no subjective hypothermia, + nocturia Eyes: no blurry vision, no xerophthalmia ENT: no sore throat, no nodules palpated in neck, no dysphagia, no odynophagia, no hoarseness Cardiovascular: no CP/no SOB/no palpitations/no leg swelling Respiratory: no cough/no SOB/no wheezing Gastrointestinal: no N/no V/no D/no C/no acid reflux Musculoskeletal: no muscle aches/no joint aches Skin: no rashes, no hair loss Neurological: no tremors/no numbness/no tingling/no dizziness  I reviewed pt's medications, allergies, PMH, social hx, family hx, and changes were documented in the history of present illness. Otherwise, unchanged from my initial visit note.  Past Medical History:  Diagnosis Date  . Asthma   .  Hypertension   . Sinus congestion    No past surgical history on file. Social History   Socioeconomic History  . Marital status: Single    Spouse name: Not on file  . Number of children: 1: 4 y/o in 01/2020  . Years of education: Not on file  . Highest education level: Not on file  Occupational History  . Not on file  Tobacco Use  . Smoking status: Current Every Day Smoker    Packs/day: 1/2    Types: Cigarettes  . Smokeless tobacco: Never Used  Substance and Sexual Activity  . Alcohol use: Yes    Comment: occasional, 2 drinks a week  . Drug use: No  . Sexual activity: Not on file  Other Topics Concern  . Not on file  Social History Narrative  . Not on file   Social Determinants of Health   Financial Resource Strain:   . Difficulty of Paying Living Expenses: Not on file  Food Insecurity:   . Worried About Programme researcher, broadcasting/film/video in the Last Year: Not on file  . Ran  Out of Food in the Last Year: Not on file  Transportation Needs:   . Lack of Transportation (Medical): Not on file  . Lack of Transportation (Non-Medical): Not on file  Physical Activity:   . Days of Exercise per Week: Not on file  . Minutes of Exercise per Session: Not on file  Stress:   . Feeling of Stress : Not on file  Social Connections:   . Frequency of Communication with Friends and Family: Not on file  . Frequency of Social Gatherings with Friends and Family: Not on file  . Attends Religious Services: Not on file  . Active Member of Clubs or Organizations: Not on file  . Attends Banker Meetings: Not on file  . Marital Status: Not on file  Intimate Partner Violence:   . Fear of Current or Ex-Partner: Not on file  . Emotionally Abused: Not on file  . Physically Abused: Not on file  . Sexually Abused: Not on file   Current Outpatient Medications on File Prior to Visit  Medication Sig Dispense Refill  . acetaminophen (TYLENOL) 500 MG tablet Take 1,000 mg by mouth every 6 (six) hours  as needed for mild pain.    Marland Kitchen albuterol (PROAIR HFA) 108 (90 Base) MCG/ACT inhaler Inhale 1-2 puffs into the lungs every 4 (four) hours as needed for wheezing.     Marland Kitchen azelastine (ASTELIN) 0.1 % nasal spray     . cloNIDine (CATAPRES) 0.2 MG tablet Take 0.2 mg by mouth 3 (three) times daily.    Marland Kitchen dexamethasone (DECADRON) 1 MG tablet Take 1 tablet by mouth once at 11 pm, before coming for labs at 8 am the next morning 1 tablet 0  . doxazosin (CARDURA) 4 MG tablet Take 1 tablet (4 mg total) by mouth daily. 90 tablet 1  . fluticasone (FLONASE) 50 MCG/ACT nasal spray     . hydrochlorothiazide (HYDRODIURIL) 25 MG tablet Take 25 mg by mouth daily.    Marland Kitchen lisinopril (PRINIVIL,ZESTRIL) 40 MG tablet Take 40 mg by mouth daily.    . potassium chloride (MICRO-K) 10 MEQ CR capsule Take by mouth daily.    . sodium chloride 1 g tablet Take 2 tablets (2 g total) by mouth 3 (three) times daily. 20 tablet 0   No current facility-administered medications on file prior to visit.   No Known Allergies No family history on file.   PE: BP 120/86   Pulse (!) 102   Ht 6\' 2"  (1.88 m)   Wt (!) 303 lb (137.4 kg)   SpO2 98%   BMI 38.90 kg/m  Wt Readings from Last 3 Encounters:  05/26/20 (!) 303 lb (137.4 kg)  02/24/20 298 lb (135.2 kg)  07/28/19 285 lb (129.3 kg)   Constitutional: overweight, in NAD Eyes: PERRLA, EOMI, no exophthalmos ENT: moist mucous membranes, no thyromegaly, no cervical lymphadenopathy Cardiovascular: tachycardia, RR, No MRG Respiratory: CTA B Gastrointestinal: abdomen soft, NT, ND, BS+ Musculoskeletal: no deformities, strength intact in all 4 Skin: moist, warm, no rashes Neurological: no tremor with outstretched hands, DTR normal in all 4  ASSESSMENT: 1.  Hyperaldosteronism  2.  Endocrine hypertension  PLAN: 1.   Hyperaldosteronism -Patient with longstanding uncontrolled hypertension, previously on 5 different antihypertensive with still poor control.  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  Hypo or hyperthyroidism -but he had normal TSH  Cushing's syndrome -no signs or symptoms of  excess cortisol production he also had a normal 24-hour urine cortisol  Pheochromocytoma -no headaches, hypotensive spells, and he also had normal at last moment and if  Primary hyperparathyroidism -no history of elevated calciuno history of elevated calcium -So far, investigation pointed towards a diagnosis of primary hyperaldosteronism:  In 12/2018, his aldosterone level was normal, but above 10 and the PRA was suppressed, with an aldosterone/renin ratio undetectably high.  This pointed towards primary hyperparathyroidism.  However, I am not sure if his potassium level was normal or low at that time.  He is currently taking potassium supplements.  In 12/2019, his aldosterone level was low, at 5, and PRA was lower than 1, but detectable.  His aldosterone/renin ratio was normal, at 8.1.  In 02/2020, his aldosterone level was 11, slightly above 10, and PRA was suppressed, with an aldosterone/creatinine ratio high, at 33.3.  At that time, potassium level was slightly low, and this could be the reason why aldosterone was only slightly above 10.  In 03/2020, a salt suppression test confirmed hyper aldosterone production (aldosterone level was 14.5, normal lower than 12 mcg per 24 hours) in the setting of an elevated urine sodium of more than 200 mmol per 24 hours.  In 04/2020, an adrenal CT scan did not show any masses and we discussed at this visit that the production of aldosterone could still be unilateral, but it is more likely to be bilateral.  The only way to tell exactly is by checking an adrenal venous sampling test. If the test localizes to a particular adrenal, he is a candidate for adrenalectomy.  If the test does not localize, would likely proceed  with mineralocorticoid receptor blocker (spironolactone) is in addition to his blood pressure regimen.  However, I would first want to rule out glucocorticoid remediable hyperaldosteronism (GI) especially in the absence of a clear adrenal adenoma.  Therefore, he just performed a dexamethasone suppression test and the results are pending.  A cortisol level did return and this is appropriately suppressed.  I am waiting for the aldosterone level to return.  If not suppressed, will proceed with the AVS. -I will let you know about the results soon as they return - I will see him back in 3 months  2. HTN -Patient continues on 4 antihypertensive medications including: HCTZ, clonidine, doxazosin, hydralazine and amlodipine. -If the AVS localizes an adrenal aldosterone producing tumor, he is a good candidate for surgery. -If the AVS does not localize and renal abduction producing tumor, we will try to stop clonidine and hydralazine, since these are bothering him the most and switch to spironolactone or eplerenone. -Alternatively, if his aldosterone after dexamethasone suppression test returned low (indicative of GRA), there is no indication for AVS and in that case we would likely need to use dexamethasone.  She tells me that after he took the dexamethasone several nights ago, he felt excellent, the best he felt in a long time.  Component     Latest Ref Rng & Units 05/24/2020  ALDOSTERONE     *ng/dL 13  Dexamethasone, Serum     ng/dL 357  Cortisol - AM     mcg/dL 0.9 (L)   Aldosterone not suppressed by Dexamethasone despite good cortisol suppression >> r/o GRA. Will proceed with AVS as discussed.  Philemon Kingdom, MD PhD [facility]

## 2020-05-26 NOTE — Patient Instructions (Signed)
Please continue:  Clonidine 0.2 mg 2x daily  HCTZ 25 mg daily  Cardura 4 mg daily  Hydralazine 100 mg 3x a day  Amlodipine 10 mg daily  Please come back for a follow-up appointment in 3 months.

## 2020-05-31 ENCOUNTER — Encounter: Payer: Self-pay | Admitting: Internal Medicine

## 2020-05-31 LAB — ALDOSTERONE: Aldosterone: 13 ng/dL

## 2020-05-31 LAB — DEXAMETHASONE, BLOOD: Dexamethasone, Serum: 357 ng/dL

## 2020-05-31 LAB — CORTISOL-AM, BLOOD: Cortisol - AM: 0.9 ug/dL — ABNORMAL LOW

## 2020-05-31 NOTE — Addendum Note (Signed)
Addended by: Carlus Pavlov on: 05/31/2020 01:35 PM   Modules accepted: Orders

## 2020-06-01 ENCOUNTER — Other Ambulatory Visit: Payer: Self-pay

## 2020-06-01 ENCOUNTER — Encounter: Payer: Self-pay | Admitting: Internal Medicine

## 2020-06-01 ENCOUNTER — Telehealth (INDEPENDENT_AMBULATORY_CARE_PROVIDER_SITE_OTHER): Payer: Managed Care, Other (non HMO) | Admitting: Podiatry

## 2020-06-01 DIAGNOSIS — B351 Tinea unguium: Secondary | ICD-10-CM | POA: Diagnosis not present

## 2020-06-01 DIAGNOSIS — Z79899 Other long term (current) drug therapy: Secondary | ICD-10-CM

## 2020-06-02 ENCOUNTER — Encounter: Payer: Self-pay | Admitting: Podiatry

## 2020-06-02 ENCOUNTER — Other Ambulatory Visit: Payer: Self-pay | Admitting: Podiatry

## 2020-06-02 ENCOUNTER — Telehealth: Payer: Self-pay | Admitting: *Deleted

## 2020-06-02 DIAGNOSIS — Z79899 Other long term (current) drug therapy: Secondary | ICD-10-CM

## 2020-06-02 LAB — CBC WITH DIFFERENTIAL/PLATELET
Absolute Monocytes: 482 cells/uL (ref 200–950)
Basophils Absolute: 18 cells/uL (ref 0–200)
Basophils Relative: 0.4 %
Eosinophils Absolute: 18 cells/uL (ref 15–500)
Eosinophils Relative: 0.4 %
HCT: 47.3 % (ref 38.5–50.0)
Hemoglobin: 16.2 g/dL (ref 13.2–17.1)
Lymphs Abs: 1899 cells/uL (ref 850–3900)
MCH: 32.3 pg (ref 27.0–33.0)
MCHC: 34.2 g/dL (ref 32.0–36.0)
MCV: 94.2 fL (ref 80.0–100.0)
MPV: 10.3 fL (ref 7.5–12.5)
Monocytes Relative: 10.7 %
Neutro Abs: 2084 cells/uL (ref 1500–7800)
Neutrophils Relative %: 46.3 %
Platelets: 230 10*3/uL (ref 140–400)
RBC: 5.02 10*6/uL (ref 4.20–5.80)
RDW: 12.9 % (ref 11.0–15.0)
Total Lymphocyte: 42.2 %
WBC: 4.5 10*3/uL (ref 3.8–10.8)

## 2020-06-02 LAB — HEPATIC FUNCTION PANEL
AG Ratio: 1.5 (calc) (ref 1.0–2.5)
ALT: 27 U/L (ref 9–46)
AST: 27 U/L (ref 10–40)
Albumin: 4.3 g/dL (ref 3.6–5.1)
Alkaline phosphatase (APISO): 72 U/L (ref 36–130)
Bilirubin, Direct: 0.2 mg/dL (ref 0.0–0.2)
Globulin: 2.8 g/dL (calc) (ref 1.9–3.7)
Indirect Bilirubin: 0.6 mg/dL (calc) (ref 0.2–1.2)
Total Bilirubin: 0.8 mg/dL (ref 0.2–1.2)
Total Protein: 7.1 g/dL (ref 6.1–8.1)

## 2020-06-02 MED ORDER — TERBINAFINE HCL 250 MG PO TABS: 250.0000 mg | ORAL_TABLET | Freq: Every day | ORAL | 0 refills | Status: DC

## 2020-06-02 NOTE — Telephone Encounter (Signed)
-----   Message from Vivi Barrack, DPM sent at 06/02/2020  8:00 AM EDT ----- Val- I have sent the patient a message with the results and called in Lamisil. Can you order CBC and LFT for him to have done in 4-6 weeks?   Marchelle Folks- can you make him a 3 month appointment in the office for Lamisil check? Thanks.

## 2020-06-02 NOTE — Telephone Encounter (Signed)
Mailed copy of lab orders with note to perform 4-6 weeks after beginning lamisil.

## 2020-06-06 DIAGNOSIS — B351 Tinea unguium: Secondary | ICD-10-CM | POA: Insufficient documentation

## 2020-06-06 NOTE — Progress Notes (Signed)
Virtual Visit via Telephone Note  I connected with Andre Dean on 06/06/20 at  1:00 PM EDT by telephone and verified that I am speaking with the correct person using two identifiers.  Location: Patient: Home Provider: Office   I discussed the limitations, risks, security and privacy concerns of performing an evaluation and management service by telephone and the availability of in person appointments. I also discussed with the patient that there may be a patient responsible charge related to this service. The patient expressed understanding and agreed to proceed.   History of Present Illness: 44 year old male previously underwent a course of Lamisil due to toenail fungus.  He states that the coloration of the toes was doing much better however started to come back and is requesting to have another round of Lamisil.  He tolerated medication previously but any side effects.   Observations/Objective: Nails appear to be dystrophic and discolored with yellow-brown discoloration but there is no dark discoloration at this time.  No discoloration to the skin.  Currently no pain in the nails and denies any redness or drainage.  Assessment and Plan: Onychomycosis  Discussed side effects of Lamisil as well as other alternatives.  He wishes to pursue another round of Lamisil.  I ordered a CBC and LFT.  Once we have this back I will order the medication form for 90 days and will recheck blood work 6 weeks into treatment.  Follow Up Instructions: Repeat blood work 6 weeks.   I discussed the assessment and treatment plan with the patient. The patient was provided an opportunity to ask questions and all were answered. The patient agreed with the plan and demonstrated an understanding of the instructions.   The patient was advised to call back or seek an in-person evaluation if the symptoms worsen or if the condition fails to improve as anticipated.  I provided 6 minutes of non-face-to-face time  during this encounter.   Vivi Barrack, DPM

## 2020-06-07 ENCOUNTER — Other Ambulatory Visit: Payer: Self-pay | Admitting: Internal Medicine

## 2020-06-07 ENCOUNTER — Telehealth: Payer: Self-pay | Admitting: Podiatry

## 2020-06-07 ENCOUNTER — Telehealth: Payer: Self-pay | Admitting: *Deleted

## 2020-06-07 DIAGNOSIS — E269 Hyperaldosteronism, unspecified: Secondary | ICD-10-CM

## 2020-06-07 NOTE — Telephone Encounter (Signed)
Called and left a message patient and I was needing a pharmacy so I could call in the lamisil so the patient could get it for $10. Andre Dean

## 2020-06-07 NOTE — Telephone Encounter (Signed)
Called patient and lvm for him to call the office and schedule an appointment for a 3 month Lamisil check with Dr. Ardelle Anton

## 2020-06-09 ENCOUNTER — Other Ambulatory Visit: Payer: Self-pay | Admitting: Internal Medicine

## 2020-06-12 ENCOUNTER — Other Ambulatory Visit: Payer: Self-pay | Admitting: *Deleted

## 2020-06-12 ENCOUNTER — Telehealth: Payer: Self-pay | Admitting: *Deleted

## 2020-06-12 MED ORDER — TERBINAFINE HCL 250 MG PO TABS: 250.0000 mg | ORAL_TABLET | Freq: Every day | ORAL | 0 refills | Status: DC

## 2020-06-12 NOTE — Telephone Encounter (Signed)
Sent a prescription of the lamisil to [facility] on [address] per Dr Ardelle Anton due to the insurance does not cover the medicine. Misty Stanley

## 2020-06-14 ENCOUNTER — Encounter: Payer: Self-pay | Admitting: Podiatry

## 2020-06-15 MED ORDER — TERBINAFINE HCL 250 MG PO TABS: 250.0000 mg | ORAL_TABLET | Freq: Every day | ORAL | 0 refills | Status: DC

## 2020-06-21 ENCOUNTER — Ambulatory Visit
Admission: RE | Admit: 2020-06-21 | Discharge: 2020-06-21 | Disposition: A | Discharge status: Home / Self Care | Payer: Managed Care, Other (non HMO) | Source: Ambulatory Visit | Attending: Internal Medicine | Admitting: Internal Medicine

## 2020-06-21 ENCOUNTER — Other Ambulatory Visit: Payer: Self-pay

## 2020-06-21 ENCOUNTER — Encounter: Payer: Self-pay | Admitting: *Deleted

## 2020-06-21 ENCOUNTER — Encounter: Payer: Self-pay | Admitting: Internal Medicine

## 2020-06-21 DIAGNOSIS — E269 Hyperaldosteronism, unspecified: Secondary | ICD-10-CM

## 2020-06-21 HISTORY — PX: IR RADIOLOGIST EVAL & MGMT: IMG5224

## 2020-06-21 NOTE — Consult Note (Signed)
Chief Complaint: Patient was consulted remotely today (TeleHealth) for adrenal vein sampling at the request of Wilder.    Referring Physician(s): Gherghe,Cristina  History of Present Illness: Demon Volante is a 44 y.o. male referred by Dr. 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  He had a number of questions about the procedure prior to scheduling of the procedure and therefore a telemedicine consultation was scheduled to answer his questions.  Mr Hsiao states that he has had excessive fatigue for approximately the last year.  He was referred to Dr. Cruzita Lederer by Dr. Maia Petties for evaluation of hyperaldosteronism in the setting of uncontrolled hypertension.  He is currently on a 5 drug antihypertensive regimen.  He has had low potassium.  Investigation for pheochromocytoma was negative.  Evaluation for Cushing syndrome was negative.  He does have an elevated aldosterone/PRA level and elevated aldosterone/renin ratio.  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  A CT of the abdomen with and without contrast on 05/09/2020 demonstrated no adrenal masses or adrenal enlargement.   Past Medical History:  Diagnosis Date  . Asthma   . Hypertension   . Sinus congestion     No past surgical history on file.  Allergies: Patient has no known allergies.  Medications: Prior to Admission medications   Medication Sig Start Date End Date Taking? Authorizing Provider  acetaminophen (TYLENOL) 500 MG tablet Take 1,000 mg by mouth every 6 (six) hours as needed for mild pain.    [provider]  albuterol (PROAIR HFA) 108 (90 Base) MCG/ACT inhaler Inhale 1-2 puffs into the lungs every 4 (four) hours as needed for wheezing.     [provider]  azelastine (ASTELIN) 0.1 % nasal spray  07/22/19   [provider]  cloNIDine (CATAPRES)  0.2 MG tablet Take 0.2 mg by mouth 3 (three) times daily. 06/22/19   [provider]  dexamethasone (DECADRON) 1 MG tablet Take 1 tablet by mouth once at 11 pm, before coming for labs at 8 am the next morning 05/11/20   Philemon Kingdom, MD  doxazosin (CARDURA) 4 MG tablet TAKE 1 TABLET (4 MG TOTAL) BY MOUTH DAILY. 06/09/20   Philemon Kingdom, MD  fluticasone Asencion Islam) 50 MCG/ACT nasal spray  07/22/19   [provider]  hydrALAZINE (APRESOLINE) 100 MG tablet Take 1 tablet (100 mg total) by mouth 2 (two) times daily. 05/26/20   Philemon Kingdom, MD  hydrochlorothiazide (HYDRODIURIL) 25 MG tablet Take 1 tablet (25 mg total) by mouth daily. 05/26/20   Philemon Kingdom, MD  lisinopril (PRINIVIL,ZESTRIL) 40 MG tablet Take 40 mg by mouth daily.    [provider]  potassium chloride (MICRO-K) 10 MEQ CR capsule Take by mouth daily. 06/26/19   [provider]  sodium chloride 1 g tablet Take 2 tablets (2 g total) by mouth 3 (three) times daily. 03/23/20   Philemon Kingdom, MD  terbinafine (LAMISIL) 250 MG tablet Take 1 tablet (250 mg total) by mouth daily. 06/15/20   Trula Slade, DPM     No family history on file.  Social History   Socioeconomic History  . Marital status: Single    Spouse name: Not on file  . Number of children: Not on file  . Years of education: Not on file  . Highest education level: Not on file  Occupational History  . Not on file  Tobacco Use  . Smoking status: Current Every Day Smoker  Packs/day: 1.00    Years: 1.00    Pack years: 1.00    Types: Cigarettes  . Smokeless tobacco: Never Used  Vaping Use  . Vaping Use: Never used  Substance and Sexual Activity  . Alcohol use: Yes    Comment: occasional  . Drug use: No  . Sexual activity: Not on file  Other Topics Concern  . Not on file  Social History Narrative  . Not on file   Social Determinants of Health   Financial Resource Strain:   . Difficulty of Paying Living  Expenses:   Food Insecurity:   . Worried About Programme researcher, broadcasting/film/video in the Last Year:   . Barista in the Last Year:   Transportation Needs:   . Freight forwarder (Medical):   Marland Kitchen Lack of Transportation (Non-Medical):   Physical Activity:   . Days of Exercise per Week:   . Minutes of Exercise per Session:   Stress:   . Feeling of Stress :   Social Connections:   . Frequency of Communication with Friends and Family:   . Frequency of Social Gatherings with Friends and Family:   . Attends Religious Services:   . Active Member of Clubs or Organizations:   . Attends Banker Meetings:   Marland Kitchen Marital Status:     Review of Systems  Constitutional: Positive for activity change and fatigue.  Respiratory: Negative.   Cardiovascular: Negative.   Gastrointestinal: Negative.   Genitourinary: Negative.   Musculoskeletal: Negative.   Neurological: Negative.     Review of Systems: A 12 point ROS discussed and pertinent positives are indicated in the HPI above.  All other systems are negative.  Physical Exam No direct physical exam was performed (except for noted visual exam findings with Video Visits).   Vital Signs: There were no vitals taken for this visit.  Imaging: No results found.  Labs:  CBC: Recent Labs    06/25/19 1019 06/01/20 1624  WBC 4.5 4.5  HGB 15.6 16.2  HCT 44.8 47.3  PLT 238 230    COAGS: No results for input(s): INR, APTT in the last 8760 hours.  BMP: Recent Labs    03/15/20 1607  K 3.4*    LIVER FUNCTION TESTS: Recent Labs    06/25/19 1019 06/01/20 1624  BILITOT 0.5 0.8  AST 30 27  ALT 21 27  ALKPHOS 69  --   PROT 6.9 7.1  ALBUMIN 4.4  --     Assessment and Plan:  I spoke with Mr. Kersten over the phone.  We discussed indications and details of adrenal vein sampling including how the procedure is performed under fluoroscopic guidance with venography and selective venous sampling both prior to, and following,  cosyntropin stimulation.  The procedure is performed utilizing IV conscious sedation.  Venous access is typically via the common femoral vein but also sometimes via the jugular vein depending on venous anatomy.  After approximately a 2-hour recovery Mr. Noll would be discharged to home.  I answered all of Mr. Nodal' questions.  We discussed the utility of adrenal vein sampling should there be evidence of significant lateralization of aldosterone production to either one of the adrenal glands, in which case he might benefit from unilateral adrenalectomy.  Even without any lateralization, this would be helpful information for Dr. Elvera Lennox to continue to manage his hyperaldosteronism.  After discussion, Mr. Leeth would like to proceed to schedule the procedure. The procedure will be performed on an outpatient  basis at [facility], likely within the next few weeks.  Thank you for this interesting consult.  I greatly enjoyed meeting Zakary Kimura and look forward to participating in their care.  A copy of this report was sent to the requesting provider on this date.  Electronically Signed: Reola Calkins 06/21/2020, 12:58 PM     I spent a total of 30 Minutes in remote  clinical consultation, greater than 50% of which was counseling/coordinating care for adrenal vein sampling.    Visit type: Audio only (telephone). Audio (no video) only due to patient's lack of internet/smartphone capability. Alternative for in-person consultation at [facility], [address] [place], [place], [place]. This visit type was conducted due to national recommendations for restrictions regarding the COVID-19 Pandemic (e.g. social distancing).  This format is felt to be most appropriate for this patient at this time.  All issues noted in this document were discussed and addressed.

## 2020-06-21 NOTE — Progress Notes (Signed)
Received labs from 06/15/2020 from PCP (we will scan results): BMP normal with exception of a low potassium of 3.0 (3.5-5.3) Low magnesium at 1.4 (1.5-2.5) High CK at 730 (44-196)

## 2020-06-27 ENCOUNTER — Telehealth (HOSPITAL_COMMUNITY): Payer: Self-pay | Admitting: Radiology

## 2020-06-27 NOTE — Telephone Encounter (Signed)
Called pt, left VM for him to call me to set up his adrenal vein sampling procedure with Dr. Fredia Sorrow. JM

## 2020-07-11 ENCOUNTER — Other Ambulatory Visit: Payer: Self-pay | Admitting: Radiology

## 2020-07-11 DIAGNOSIS — Z79899 Other long term (current) drug therapy: Secondary | ICD-10-CM | POA: Diagnosis not present

## 2020-07-11 DIAGNOSIS — E876 Hypokalemia: Secondary | ICD-10-CM | POA: Diagnosis not present

## 2020-07-12 ENCOUNTER — Other Ambulatory Visit: Payer: Self-pay | Admitting: Radiology

## 2020-07-12 DIAGNOSIS — J4540 Moderate persistent asthma, uncomplicated: Secondary | ICD-10-CM | POA: Diagnosis not present

## 2020-07-12 LAB — HEPATIC FUNCTION PANEL
AG Ratio: 1.6 (calc) (ref 1.0–2.5)
ALT: 18 U/L (ref 9–46)
AST: 24 U/L (ref 10–40)
Albumin: 4.5 g/dL (ref 3.6–5.1)
Alkaline phosphatase (APISO): 72 U/L (ref 36–130)
Bilirubin, Direct: 0.2 mg/dL (ref 0.0–0.2)
Globulin: 2.8 g/dL (calc) (ref 1.9–3.7)
Indirect Bilirubin: 0.5 mg/dL (calc) (ref 0.2–1.2)
Total Bilirubin: 0.7 mg/dL (ref 0.2–1.2)
Total Protein: 7.3 g/dL (ref 6.1–8.1)

## 2020-07-12 LAB — CBC WITH DIFFERENTIAL/PLATELET
Absolute Monocytes: 495 cells/uL (ref 200–950)
Basophils Absolute: 29 cells/uL (ref 0–200)
Basophils Relative: 0.6 %
Eosinophils Absolute: 20 cells/uL (ref 15–500)
Eosinophils Relative: 0.4 %
HCT: 46.4 % (ref 38.5–50.0)
Hemoglobin: 16.2 g/dL (ref 13.2–17.1)
Lymphs Abs: 1666 cells/uL (ref 850–3900)
MCH: 32.4 pg (ref 27.0–33.0)
MCHC: 34.9 g/dL (ref 32.0–36.0)
MCV: 92.8 fL (ref 80.0–100.0)
MPV: 10.1 fL (ref 7.5–12.5)
Monocytes Relative: 10.1 %
Neutro Abs: 2690 cells/uL (ref 1500–7800)
Neutrophils Relative %: 54.9 %
Platelets: 240 10*3/uL (ref 140–400)
RBC: 5 10*6/uL (ref 4.20–5.80)
RDW: 13.2 % (ref 11.0–15.0)
Total Lymphocyte: 34 %
WBC: 4.9 10*3/uL (ref 3.8–10.8)

## 2020-07-13 ENCOUNTER — Other Ambulatory Visit: Payer: Self-pay

## 2020-07-13 ENCOUNTER — Other Ambulatory Visit: Payer: Self-pay | Admitting: Internal Medicine

## 2020-07-13 ENCOUNTER — Encounter (HOSPITAL_COMMUNITY): Payer: Self-pay

## 2020-07-13 ENCOUNTER — Ambulatory Visit (HOSPITAL_COMMUNITY)
Admission: RE | Admit: 2020-07-13 | Discharge: 2020-07-13 | Disposition: A | Discharge status: Home / Self Care | Payer: BC Managed Care – PPO | Source: Ambulatory Visit | Attending: Internal Medicine | Admitting: Internal Medicine

## 2020-07-13 DIAGNOSIS — Z87891 Personal history of nicotine dependence: Secondary | ICD-10-CM | POA: Diagnosis not present

## 2020-07-13 DIAGNOSIS — E2609 Other primary hyperaldosteronism: Secondary | ICD-10-CM | POA: Insufficient documentation

## 2020-07-13 DIAGNOSIS — R5383 Other fatigue: Secondary | ICD-10-CM | POA: Diagnosis not present

## 2020-07-13 DIAGNOSIS — Z79899 Other long term (current) drug therapy: Secondary | ICD-10-CM | POA: Diagnosis not present

## 2020-07-13 DIAGNOSIS — F17210 Nicotine dependence, cigarettes, uncomplicated: Secondary | ICD-10-CM | POA: Diagnosis not present

## 2020-07-13 DIAGNOSIS — J45909 Unspecified asthma, uncomplicated: Secondary | ICD-10-CM | POA: Diagnosis not present

## 2020-07-13 DIAGNOSIS — E269 Hyperaldosteronism, unspecified: Secondary | ICD-10-CM

## 2020-07-13 DIAGNOSIS — I10 Essential (primary) hypertension: Secondary | ICD-10-CM | POA: Diagnosis not present

## 2020-07-13 DIAGNOSIS — E876 Hypokalemia: Secondary | ICD-10-CM | POA: Insufficient documentation

## 2020-07-13 DIAGNOSIS — Z8639 Personal history of other endocrine, nutritional and metabolic disease: Secondary | ICD-10-CM | POA: Diagnosis not present

## 2020-07-13 HISTORY — PX: IR US GUIDE VASC ACCESS RIGHT: IMG2390

## 2020-07-13 HISTORY — PX: IR VENOGRAM ADRENAL BI: IMG685

## 2020-07-13 HISTORY — PX: IR VENOGRAM RENAL UNI LEFT: IMG680

## 2020-07-13 HISTORY — PX: IR VENOCAVAGRAM IVC: IMG678

## 2020-07-13 HISTORY — PX: IR VENOUS SAMPLING: IMG694

## 2020-07-13 LAB — CORTISOL
Cortisol, Plasma: 10.1 ug/dL
Cortisol, Plasma: 49.7 ug/dL
Cortisol, Plasma: 7.6 ug/dL
Cortisol, Plasma: 9.6 ug/dL
Cortisol, Plasma: 15.5 ug/dL
Cortisol, Plasma: 21.3 ug/dL
Cortisol, Plasma: >100 ug/dL
Cortisol, Plasma: >100 ug/dL

## 2020-07-13 LAB — CBC
HCT: 48.1 % (ref 39.0–52.0)
Hemoglobin: 16.4 g/dL (ref 13.0–17.0)
MCH: 31.7 pg (ref 26.0–34.0)
MCHC: 34.1 g/dL (ref 30.0–36.0)
MCV: 93 fL (ref 80.0–100.0)
Platelets: 252 10*3/uL (ref 150–400)
RBC: 5.17 MIL/uL (ref 4.22–5.81)
RDW: 13.8 % (ref 11.5–15.5)
WBC: 4.6 10*3/uL (ref 4.0–10.5)
nRBC: 0 % (ref 0.0–0.2)

## 2020-07-13 LAB — BASIC METABOLIC PANEL
Anion gap: 11 (ref 5–15)
BUN: 10 mg/dL (ref 6–20)
CO2: 26 mmol/L (ref 22–32)
Calcium: 9 mg/dL (ref 8.9–10.3)
Chloride: 100 mmol/L (ref 98–111)
Creatinine, Ser: 1.1 mg/dL (ref 0.61–1.24)
GFR calc Af Amer: >60 mL/min (ref >60)
GFR calc non Af Amer: >60 mL/min (ref >60)
Glucose, Bld: 110 mg/dL — ABNORMAL HIGH (ref 70–99)
Potassium: 2.9 mmol/L — ABNORMAL LOW (ref 3.5–5.1)
Sodium: 137 mmol/L (ref 135–145)

## 2020-07-13 LAB — CORTISOL-AM, BLOOD: Cortisol - AM: 10 ug/dL (ref 6.7–22.6)

## 2020-07-13 LAB — PROTIME-INR
INR: 1 (ref 0.8–1.2)
Prothrombin Time: 12.9 seconds (ref 11.4–15.2)

## 2020-07-13 MED ORDER — DEXTROSE 5 % IV SOLN
250.0000 ug | Freq: Once | INTRAVENOUS | Status: AC
  Administered 2020-07-13: 0.25 mg via INTRAVENOUS
  Filled 2020-07-13: qty 0.25

## 2020-07-13 MED ORDER — LIDOCAINE HCL 1 % IJ SOLN
INTRAMUSCULAR | Status: AC
  Filled 2020-07-13: qty 20

## 2020-07-13 MED ORDER — MIDAZOLAM HCL 2 MG/2ML IJ SOLN
INTRAMUSCULAR | Status: AC
  Filled 2020-07-13: qty 2

## 2020-07-13 MED ORDER — FENTANYL CITRATE (PF) 100 MCG/2ML IJ SOLN
INTRAMUSCULAR | Status: AC | PRN
  Administered 2020-07-13 (×2): 25 ug via INTRAVENOUS

## 2020-07-13 MED ORDER — IOHEXOL 300 MG/ML  SOLN
100.0000 mL | Freq: Once | INTRAMUSCULAR | Status: AC | PRN
  Administered 2020-07-13: 40 mL via INTRAVENOUS

## 2020-07-13 MED ORDER — SODIUM CHLORIDE 0.9 % IV SOLN: INTRAVENOUS | Status: DC

## 2020-07-13 MED ORDER — MIDAZOLAM HCL 2 MG/2ML IJ SOLN
INTRAMUSCULAR | Status: AC | PRN
  Administered 2020-07-13: 1 mg via INTRAVENOUS

## 2020-07-13 MED ORDER — LIDOCAINE HCL (PF) 1 % IJ SOLN
INTRAMUSCULAR | Status: AC | PRN
  Administered 2020-07-13: 10 mL

## 2020-07-13 MED ORDER — FENTANYL CITRATE (PF) 100 MCG/2ML IJ SOLN
INTRAMUSCULAR | Status: AC
  Filled 2020-07-13: qty 2

## 2020-07-13 MED ORDER — IOHEXOL 300 MG/ML  SOLN
100.0000 mL | Freq: Once | INTRAMUSCULAR | Status: AC | PRN
  Administered 2020-07-13: 8 mL via INTRAVENOUS

## 2020-07-13 NOTE — Procedures (Signed)
Interventional Radiology Procedure Note  Procedure: Left renal and bilateral adrenal venography. Bilateral adrenal vein sampling.   Access: Right common femoral vein  Complications: None  Estimated Blood Loss: < 10 mL  Findings: Venous sampling prior to stimulation: infrarenal IVC, suprarenal IVC, left renal vein, bilateral adrenal veins.  Adrenal stimulation performed with a bolus infusion of 150 mcg IV cosyntropin.  Post stimulation sampling: Bilateral adrenal veins, left renal vein.  Jodi Marble. Fredia Sorrow, M.D Pager:  [phone]

## 2020-07-13 NOTE — Progress Notes (Signed)
Spoke with Kathreen Devoid, PA about pt returning to work. Stated he should be out 48 hours. Work note given to pt

## 2020-07-13 NOTE — Sedation Documentation (Signed)
Infusion stopped per verbal orders from Dr Fredia Sorrow  Verbal orders read back and verified.

## 2020-07-13 NOTE — Discharge Instructions (Addendum)
Femoral Site Care This sheet gives you information about how to care for yourself after your procedure. Your health care provider may also give you more specific instructions. If you have problems or questions, contact your health care provider. What can I expect after the procedure?  After the procedure, it is common to have:  Bruising that usually fades within 1-2 weeks.  Tenderness at the site. Follow these instructions at home: Wound care 1. Follow instructions from your health care provider about how to take care of your insertion site. Make sure you: ? Wash your hands with soap and water before you change your bandage (dressing). If soap and water are not available, use hand sanitizer. ? Remove your dressing as told by your health care provider. In 24 hours 2. Do not take baths, swim, or use a hot tub until your health care provider approves. 3. You may shower 24-48 hours after the procedure or as told by your health care provider. ? Gently wash the site with plain soap and water. ? Pat the area dry with a clean towel. ? Do not rub the site. This may cause bleeding. 4. Do not apply powder or lotion to the site. Keep the site clean and dry. 5. Check your femoral site every day for signs of infection. Check for: ? Redness, swelling, or pain. ? Fluid or blood. ? Warmth. ? Pus or a bad smell. Activity 1. For the first 2-3 days after your procedure, or as long as directed: ? Avoid climbing stairs as much as possible. ? Do not squat. 2. Do not lift anything that is heavier than 10 lb (4.5 kg), or the limit that you are told, until your health care provider says that it is safe. For 2 days 3. Rest as directed. ? Avoid sitting for a long time without moving. Get up to take short walks every 1-2 hours. 4. Do not drive for 24 hours if you were given a medicine to help you relax (sedative). General instructions  Take over-the-counter and prescription medicines only as told by your  health care provider.  Keep all follow-up visits as told by your health care provider. This is important. Contact a health care provider if you have:  A fever or chills.  You have redness, swelling, or pain around your insertion site. Get help right away if:  The catheter insertion area swells very fast.  You pass out.  You suddenly start to sweat or your skin gets clammy.  The catheter insertion area is bleeding, and the bleeding does not stop when you hold steady pressure on the area. These symptoms may represent a serious problem that is an emergency. Do not wait to see if the symptoms will go away. Get medical help right away. Call your local emergency services (911 in the [place] Do not drive yourself to the hospital. Summary  After the procedure, it is common to have bruising that usually fades within 1-2 weeks.  Check your femoral site every day for signs of infection.  Do not lift anything that is heavier than 10 lb (4.5 kg), or the limit that you are told, until your health care provider says that it is safe. This information is not intended to replace advice given to you by your health care provider. Make sure you discuss any questions you have with your health care provider. Document Revised: 12/29/2017 Document Reviewed: 12/29/2017 Elsevier Patient Education  2020 Elsevier Inc[employer]   Moderate Conscious Sedation, Adult, Care After These instructions  provide you with information about caring for yourself after your procedure. Your health care provider may also give you more specific instructions. Your treatment has been planned according to current medical practices, but problems sometimes occur. Call your health care provider if you have any problems or questions after your procedure. What can I expect after the procedure? After your procedure, it is common: To feel sleepy for several hours. To feel clumsy and have poor balance for several hours. To have poor judgment for  several hours. To vomit if you eat too soon. Follow these instructions at home: For at least 24 hours after the procedure:  Do not: Participate in activities where you could fall or become injured. Drive. Use heavy machinery. Drink alcohol. Take sleeping pills or medicines that cause drowsiness. Make important decisions or sign legal documents. Take care of children on your own. Rest. Eating and drinking Follow the diet recommended by your health care provider. If you vomit: Drink water, juice, or soup when you can drink without vomiting. Make sure you have little or no nausea before eating solid foods. General instructions Have a responsible adult stay with you until you are awake and alert. Take over-the-counter and prescription medicines only as told by your health care provider. If you smoke, do not smoke without supervision. Keep all follow-up visits as told by your health care provider. This is important. Contact a health care provider if: You keep feeling nauseous or you keep vomiting. You feel light-headed. You develop a rash. You have a fever. Get help right away if: You have trouble breathing. This information is not intended to replace advice given to you by your health care provider. Make sure you discuss any questions you have with your health care provider. Document Revised: 11/28/2017 Document Reviewed: 04/06/2016 Elsevier Patient Education  2020 [employer].

## 2020-07-13 NOTE — Sedation Documentation (Signed)
Infusion bolus complete, medication to be infused at 50 ml/hr.

## 2020-07-13 NOTE — H&P (Signed)
Referring Physician(s): Gherghe,Cristina  Supervising Physician: Irish Lack  Patient Status:  Andre Dean  Chief Complaint:  Fatigue  Subjective: Patient familiar to IR service from recent virtual consultation with Dr. Fredia Sorrow on 06/21/2020 to discuss adrenal vein sampling procedure for further work-up of hyperaldosteronism with associated uncontrolled hypertension and hypokalemia.  Investigation for pheochromocytoma and Cushing's syndrome was negative.  He presents today for the procedure.  He currently denies fever, headache, chest pain, dyspnea, cough, abdominal/back pain, nausea, vomiting or bleeding.  He does continue to smoke.  Additional history as below.  Past Medical History:  Diagnosis Date  . Asthma   . Hypertension   . Sinus congestion    Past Surgical History:  Procedure Laterality Date  . IR RADIOLOGIST EVAL & MGMT  06/21/2020      Allergies: Patient has no known allergies.  Medications: Prior to Admission medications   Medication Sig Start Date End Date Taking? Authorizing Provider  acetaminophen (TYLENOL) 500 MG tablet Take 1,000 mg by mouth every 6 (six) hours as needed for mild pain.   Yes [provider]  albuterol (PROAIR HFA) 108 (90 Base) MCG/ACT inhaler Inhale 1-2 puffs into the lungs every 4 (four) hours as needed for wheezing.    Yes [provider]  cloNIDine (CATAPRES) 0.2 MG tablet Take 0.2 mg by mouth daily.  06/22/19  Yes [provider]  doxazosin (CARDURA) 4 MG tablet TAKE 1 TABLET (4 MG TOTAL) BY MOUTH DAILY. 06/09/20  Yes Carlus Pavlov, MD  hydrALAZINE (APRESOLINE) 100 MG tablet Take 1 tablet (100 mg total) by mouth 2 (two) times daily. 05/26/20  Yes Carlus Pavlov, MD  hydrochlorothiazide (HYDRODIURIL) 25 MG tablet Take 1 tablet (25 mg total) by mouth daily. 05/26/20  Yes Carlus Pavlov, MD  magnesium oxide (MAG-OX) 400 MG tablet Take 400 mg by mouth 2 (two) times daily.   Yes [provider]    potassium chloride (MICRO-K) 10 MEQ CR capsule Take 10 mEq by mouth daily.  06/26/19  Yes [provider]  dexamethasone (DECADRON) 1 MG tablet Take 1 tablet by mouth once at 11 pm, before coming for labs at 8 am the next morning Patient not taking: Reported on 07/06/2020 05/11/20   Carlus Pavlov, MD  sodium chloride 1 g tablet Take 2 tablets (2 g total) by mouth 3 (three) times daily. Patient not taking: Reported on 07/06/2020 03/23/20   Carlus Pavlov, MD  terbinafine (LAMISIL) 250 MG tablet Take 1 tablet (250 mg total) by mouth daily. Patient not taking: Reported on 07/06/2020 06/15/20   Vivi Barrack, DPM     Vital Signs: BP 133/86   Pulse 81   Temp 97.9 F (36.6 C) (Oral)   Resp 16   Ht 6\' 2"  (1.88 m)   Wt 280 lb (127 kg)   SpO2 98%   BMI 35.95 kg/m   Physical Exam awake, alert.  Chest with distant breath sounds bilaterally.  Heart with regular rate and rhythm.  Abdomen soft, positive bowel sounds, nontender.  Trace bilateral pretibial edema, more so on the right.  Imaging: No results found.  Labs:  CBC: Recent Labs    06/01/20 1624 07/11/20 1636 07/13/20 0630  WBC 4.5 4.9 4.6  HGB 16.2 16.2 16.4  HCT 47.3 46.4 48.1  PLT 230 240 252    COAGS: Recent Labs    07/13/20 0630  INR 1.0    BMP: Recent Labs    03/15/20 1607 07/13/20 0630  NA  --  137  K 3.4* 2.9*  CL  --  100  CO2  --  26  GLUCOSE  --  110*  BUN  --  10  CALCIUM  --  9.0  CREATININE  --  1.10  GFRNONAA  --  >60  GFRAA  --  >60    LIVER FUNCTION TESTS: Recent Labs    06/01/20 1624 07/11/20 1636  BILITOT 0.8 0.7  AST 27 24  ALT 27 18  PROT 7.1 7.3    Assessment and Plan: 44 year old smoker with history of hyperaldosteronism in the setting of uncontrolled hypertension and hypokalemia; negative work-up for Cushing syndrome as well as pheochromocytoma.  Following recent consultation with Dr. Fredia Sorrow he presents today for bilateral adrenal vein sampling procedure.   Details/risks of procedure, including but not limited to, internal bleeding, infection, injury to adjacent structures discussed with patient with his understanding and consent. K today 2.9.    Electronically Signed: D. Jeananne Rama, PA-C 07/13/2020, 7:31 AM   I spent a total of 25 minutes at the the patient's bedside AND on the patient's hospital floor or unit, greater than 50% of which was counseling/coordinating care for bilateral adrenal venography with venous sampling

## 2020-07-16 LAB — ALDOSTERONE
Aldosterone: 5.5 ng/dL (ref 0.0–30.0)
Aldosterone: 37.9 ng/dL — ABNORMAL HIGH (ref 0.0–30.0)
Aldosterone: 5.4 ng/dL (ref 0.0–30.0)
Aldosterone: 9.5 ng/dL (ref 0.0–30.0)
Aldosterone: 24.5 ng/dL (ref 0.0–30.0)

## 2020-07-18 LAB — ALDOSTERONE: Aldosterone: 11.1 ng/dL (ref 0.0–30.0)

## 2020-07-19 ENCOUNTER — Encounter: Payer: Self-pay | Admitting: Internal Medicine

## 2020-07-19 ENCOUNTER — Other Ambulatory Visit: Payer: Self-pay | Admitting: Interventional Radiology

## 2020-07-19 DIAGNOSIS — E269 Hyperaldosteronism, unspecified: Secondary | ICD-10-CM

## 2020-07-19 LAB — ALDOSTERONE
Aldosterone: 2241.3 ng/dL — ABNORMAL HIGH (ref 0.0–30.0)
Aldosterone: 300.4 ng/dL — ABNORMAL HIGH (ref 0.0–30.0)
Aldosterone: 586.3 ng/dL — ABNORMAL HIGH (ref 0.0–30.0)

## 2020-07-21 ENCOUNTER — Encounter: Payer: Self-pay | Admitting: Internal Medicine

## 2020-07-25 ENCOUNTER — Other Ambulatory Visit: Payer: Self-pay | Admitting: Endocrinology

## 2020-07-25 MED ORDER — EPLERENONE 25 MG PO TABS: 25.0000 mg | ORAL_TABLET | Freq: Every day | ORAL | 2 refills | Status: DC

## 2020-07-26 ENCOUNTER — Encounter: Payer: Self-pay | Admitting: Internal Medicine

## 2020-08-03 ENCOUNTER — Encounter: Payer: Self-pay | Admitting: Internal Medicine

## 2020-08-14 ENCOUNTER — Encounter: Payer: Self-pay | Admitting: Internal Medicine

## 2020-08-15 DIAGNOSIS — J4540 Moderate persistent asthma, uncomplicated: Secondary | ICD-10-CM | POA: Diagnosis not present

## 2020-08-16 ENCOUNTER — Telehealth: Payer: Self-pay

## 2020-08-16 ENCOUNTER — Telehealth: Payer: Managed Care, Other (non HMO)

## 2020-08-16 NOTE — Telephone Encounter (Signed)
Spoke to patient and he can come in or do a virtual but not until after 3:30 due to being short staff at work he can not take time off.  Please advise.

## 2020-08-16 NOTE — Telephone Encounter (Signed)
-----   Message from Andre Pavlov, MD sent at 08/16/2020 11:07 AM EDT ----- M, Can you check with the patient whether he can come for an appointment at 8:20 or 11:20 on Friday to go over his adrenal results and discuss about further plan?  We can also do this virtually if he prefers (but not by phone). Ty, C

## 2020-08-16 NOTE — Telephone Encounter (Signed)
OK, let;s do a virtual visit at 4:20 pm.

## 2020-08-18 ENCOUNTER — Other Ambulatory Visit: Payer: Self-pay

## 2020-08-18 ENCOUNTER — Ambulatory Visit (INDEPENDENT_AMBULATORY_CARE_PROVIDER_SITE_OTHER): Payer: BC Managed Care – PPO | Admitting: Internal Medicine

## 2020-08-18 ENCOUNTER — Encounter: Payer: Self-pay | Admitting: Internal Medicine

## 2020-08-18 VITALS — BP 148/90 | HR 96 | Ht 74.0 in | Wt 303.0 lb

## 2020-08-18 DIAGNOSIS — E269 Hyperaldosteronism, unspecified: Secondary | ICD-10-CM | POA: Diagnosis not present

## 2020-08-18 DIAGNOSIS — I152 Hypertension secondary to endocrine disorders: Secondary | ICD-10-CM | POA: Diagnosis not present

## 2020-08-18 MED ORDER — EPLERENONE 25 MG PO TABS: 25.0000 mg | ORAL_TABLET | Freq: Every day | ORAL | 3 refills | Status: DC

## 2020-08-18 NOTE — Patient Instructions (Addendum)
Try to get an [employer] blood pressure machine.  Try to increase Eplerenone to 25 mg 2x a day.  Let me know about the BP in 7 days.  For now:  Clonidine 0.2 mg 1-2x daily  HCTZ 25 mg daily  Cardura 4 mg daily  Hydralazine 100 mg 3x a day  Amlodipine 10 mg daily  Please return in September.

## 2020-08-18 NOTE — Progress Notes (Signed)
Patient ID: Andre Dean, male   DOB: 1976/09/07, 44 y.o.   MRN: [id number]   This visit occurred during the SARS-CoV-2 public health emergency.  Safety protocols were in place, including screening questions prior to the visit, additional usage of staff PPE, and extensive cleaning of exam room while observing appropriate contact time as indicated for disinfecting solutions.   HPI  Andre Dean is a 44 y.o.-year-old male, referred by his PCP, Dr. Corky Downs, for evaluation for hyperaldosteronism in the setting of uncontrolled hypertension.  Last visit 3 months ago.  Patient was diagnosed with hypertension in his 37s.  Reviewed previous blood pressure levels: BP Readings from Last 3 Encounters:  07/13/20 129/75  05/26/20 120/86  02/24/20 (!) 130/98  01/21/2020: 192/121  He is not checking his blood pressures at home.   At our first visit, he was on the following antihypertensive regimen:  Lisinopril 40 mg daily  Clonidine 0.2 mg 2x a day: ~4:30 to 5 PM when he arrives home from work, and at bedtime.  He cannot take this in the morning because of fatigue.  Hydralazine 100 mg 3x a day  HCTZ 25 mg daily  Amlodipine 10 mg daily - added 12/2018  To be able to better investigate his hyperaldosteronism, we changed his regimen.  We changed to:  Clonidine 0.2 mg 1-2x daily (he can only take it once a day when he is working due to significant fatigue)  HCTZ 25 mg daily  Cardura 4 mg daily  Hydralazine 100 mg 3x a day  Amlodipine 10 mg daily  On 07/25/2020:  Eplerenone 25 mg daily - was added by my colleague  He does have side effects to the above regimen:  - He feels that hydralazine causes exacerbation of his allergies.  - Clonidine is causing him significant fatigue  He also has a history of low potassium levels and is taking potassium 10 mg daily.   Reviewed potassium levels: Lab Results  Component Value Date   K 2.9 (L) 07/13/2020   K 3.4 (L) 03/15/2020   K 3.4 (L)  09/04/2018   K 3.2 (L) 03/05/2018  02/22/2020: potassium 3.6 (3.5-5.3).  Received labs from 06/15/2020 from PCP (we will scan results): BMP normal with exception of a low potassium of 3.0 (3.5-5.3) Low magnesium at 1.4 (1.5-2.5) High CK at 730 (44-196)  He does not have a history of hypotensive spells, headaches, palpitations, chest pain, blurry vision.  Investigation for pheochromocytoma was negative: 01/11/2019: Plasma metanephrines 38 (0-62), plasma normetanephrine 75 (0-175)  We also tested him for Cushing syndrome and the investigation was negative: 01/11/2019: 24-hour urine cortisol 15 (5-64)  However, he had an increased aldosterone/PRA level: 02/22/2020: Aldosterone 5 (0-30), PRA 0.62, aldosterone/renin ratio 8.1 01/11/2019: Aldosterone 13.5 (0-30), PRA <0.167, aldosterone/renin ratio >81.4  5 months ago we rechecked his aldosterone and PRA and the ratio was still abnormal: Component     Latest Ref Rng & Units 03/15/2020  ALDOSTERONE     * ng/dL 11  Renin Activity     0.25 - 5.82 ng/mL/h 0.33  ALDO / PRA Ratio     0.9 - 28.9 Ratio 33.3 (H)  Potassium     3.5 - 5.1 mEq/L 3.4 (L)   After the above results returned, we proceeded with confirmatory test for hyperaldosteronism: P.o. saline suppression:   - At the end of 3 days of p.o. salt tablets, his 24-hour urine aldosterone was higher than 12 mcg per 24 hours in the setting of inappropriately elevated urine  sodium, higher than 200 mmol per 24 hours.  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 No acute findings within the abdomen. No adrenal mass identified. 2. Small nonspecific pulmonary nodules are identified within the lung bases. No follow-up needed if patient is low-risk (and has no known or suspected primary neoplasm). Non-contrast chest CT can be considered in 12 months if patient is high-risk. This recommendation follows the consensus statement: Guidelines for Management of Incidental Pulmonary Nodules Detected on CT Images: From the Fleischner Society 2017; Radiology 2017; 284:228-243.  We next checked him for glucocorticoid-remediable hyperaldosteronism (GRA).  For this, we checked a dexamethasone suppression test.  Cortisol level was low after dexamethasone: Cortisol after dexamethasone returned low, confirming steroid suppression: Component     Latest Ref Rng & Units 05/24/2020  Cortisol - AM     mcg/dL 0.9 (L)   However, his aldosterone was not suppressed after dexamethasone, ruling out GRA: Component     Latest Ref Rng & Units 05/24/2020  ALDOSTERONE     ng/dL 13   He does not have a history of hypo or hyperthyroidism: 01/11/2019: TSH 3.54 No results found for: TSH   No history of diabetes or prediabetes: No results found for: HGBA1C   No history of hyperparathyroidism or hypercalcemia: No results found for: PTH  Lab Results  Component Value Date   CALCIUM 9.0 07/13/2020   CALCIUM 9.8 09/04/2018   CALCIUM 8.5 (L) 03/05/2018   He does not use stimulants, street drugs, steroids except Xolair for asthma.  No NSAIDs.  Of note, he had a normal pharmaceutical stress test on 08/22/2016.  He has a history of OSA.  He had turbinate surgery in 02/2020.  He has extensive family history of uncontrolled hypertension in M, MGM.   ROS: Constitutional: no weight gain/no weight loss, + fatigue related to his blood pressure medicines, no subjective hyperthermia, no subjective hypothermia, + nocturia Eyes: no blurry vision, no xerophthalmia ENT: no sore  throat, no nodules palpated in neck, no dysphagia, no odynophagia, no hoarseness Cardiovascular: no CP/no SOB/no palpitations/no leg swelling Respiratory: no cough/no SOB/no wheezing Gastrointestinal: no N/no V/no D/no C/no acid reflux Musculoskeletal: no muscle aches/no joint aches Skin: no rashes, no hair loss Neurological: no tremors/no numbness/no tingling/no dizziness  I reviewed pt's medications, allergies, PMH, social hx, family hx, and changes were documented in the history of present illness. Otherwise, unchanged from my initial visit note.  Past Medical History:  Diagnosis Date  . Asthma   . Hypertension   . Sinus congestion    Past Surgical History:  Procedure Laterality Date  . IR RADIOLOGIST EVAL & MGMT  06/21/2020  . IR US GUIDE VASC ACCESS RIGHT  07/13/2020  . IR VENOCAVAGRAM IVC  07/13/2020  . IR VENOGRAM ADRENAL BI  07/13/2020  . IR VENOGRAM RENAL UNI LEFT  07/13/2020  . IR VENOUS SAMPLING  07/13/2020  . IR VENOUS SAMPLING  07/13/2020   Social History   Socioeconomic History  . Marital status: Single    Spouse name: Not on file  . Number of children: 1: 65 y/o in 01/2020  . Years of education: Not on file  . Highest education level: Not on file  Occupational History  . Not on file  Tobacco Use  . Smoking status: Current Every Day Smoker    Packs/day: 1/2    Types: Cigarettes  . Smokeless tobacco: Never Used  Substance and Sexual Activity  . Alcohol use: Yes    Comment: occasional, 2 drinks a week  . Drug use: No  . Sexual activity: Not on file  Other Topics Concern  . Not on file  Social History Narrative  . Not on file   Social Determinants of Health   Financial Resource Strain:   . Difficulty of Paying Living Expenses: Not on file  Food Insecurity:   . Worried About Programme researcher, broadcasting/film/video in the Last Year: Not on file  . Ran Out of Food in the Last Year: Not on file  Transportation Needs:   . Lack of Transportation (Medical): Not on file  . Lack of  Transportation (Non-Medical): Not on file  Physical Activity:   . Days of Exercise per Week: Not on file  . Minutes of Exercise per Session: Not on file  Stress:   . Feeling of Stress : Not on file  Social Connections:   . Frequency of Communication with Friends and Family: Not on file  . Frequency of Social Gatherings with Friends and Family: Not on file  . Attends Religious Services: Not on file  . Active Member of Clubs or Organizations: Not on file  . Attends Banker Meetings: Not on file  . Marital Status: Not on file  Intimate Partner Violence:   . Fear of Current or Ex-Partner: Not on file  . Emotionally Abused: Not on file  . Physically Abused: Not on file  . Sexually Abused: Not on file   Current Outpatient Medications on File Prior to Visit  Medication Sig Dispense Refill  . acetaminophen (TYLENOL) 500 MG tablet Take 1,000 mg by mouth every 6 (six) hours as needed for mild pain.    Marland Kitchen albuterol (PROAIR HFA) 108 (90 Base) MCG/ACT inhaler Inhale 1-2 puffs into the lungs every 4 (four) hours as needed for wheezing.     . cloNIDine (CATAPRES) 0.2 MG tablet Take 0.2 mg by mouth daily.     Marland Kitchen dexamethasone (DECADRON) 1 MG tablet Take 1 tablet by mouth once at 11 pm, before coming for labs at 8 am the next morning (Patient not taking: Reported on 07/06/2020) 1 tablet 0  . doxazosin (CARDURA) 4 MG tablet TAKE 1 TABLET (4 MG TOTAL) BY MOUTH DAILY. 90 tablet 1  . eplerenone (INSPRA) 25 MG tablet Take 1 tablet (25 mg total) by mouth daily. 30 tablet 2  . hydrALAZINE (APRESOLINE) 100 MG tablet Take 1 tablet (100 mg total) by mouth 2 (two) times daily. 270 tablet 3  . hydrochlorothiazide (HYDRODIURIL) 25 MG tablet Take 1 tablet (25 mg total) by mouth daily. 90 tablet 3  . magnesium oxide (MAG-OX) 400 MG tablet Take 400 mg by mouth 2 (two) times daily.    . potassium chloride (MICRO-K) 10 MEQ CR capsule Take 10 mEq by mouth daily.     . sodium chloride 1 g tablet Take 2 tablets (2  g total) by  mouth 3 (three) times daily. (Patient not taking: Reported on 07/06/2020) 20 tablet 0  . terbinafine (LAMISIL) 250 MG tablet Take 1 tablet (250 mg total) by mouth daily. (Patient not taking: Reported on 07/06/2020) 90 tablet 0   No current facility-administered medications on file prior to visit.   No Known Allergies No family history on file.   PE: BP (!) 148/90   Pulse 96   Ht 6\' 2"  (1.88 m)   Wt (!) 303 lb (137.4 kg)   SpO2 97%   BMI 38.90 kg/m  Wt Readings from Last 3 Encounters:  08/18/20 (!) 303 lb (137.4 kg)  07/13/20 280 lb (127 kg)  05/26/20 (!) 303 lb (137.4 kg)   Constitutional: overweight, in NAD Eyes: PERRLA, EOMI, no exophthalmos ENT: moist mucous membranes, no thyromegaly, no cervical lymphadenopathy Cardiovascular: tachycardia, RR, No MRG Respiratory: CTA B Gastrointestinal: abdomen soft, NT, ND, BS+ Musculoskeletal: no deformities, strength intact in all 4 Skin: moist, warm, no rashes Neurological: no tremor with outstretched hands, DTR normal in all 4  ASSESSMENT: 1.  Hyperaldosteronism  2.  Endocrine hypertension  PLAN: 1.   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  At last visit, reviewed possible endocrine causes for hypertension. 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  He did not have hypo or hyperthyroidism, Cushing syndrome, pheochromocytoma or primary hyperparathyroidism (please see HPI). -At today's visit, we reviewed his previous investigation for hyperaldosteronism.  In 12/2018, his aldosterone level was normal, but above 10 and the PRA was suppressed, with an aldosterone/renin ratio undetectably high.  This pointed towards primary hyperparathyroidism.  However, I am not sure if his potassium level was normal or low at that time.  He is currently taking potassium supplements.  In 12/2019,  his aldosterone level was low, at 5, and PRA was lower than 1, but detectable.  His aldosterone/renin ratio was normal, at 8.1.  In 02/2020, his aldosterone level was 11, slightly above 10, and PRA was suppressed, with an aldosterone/creatinine ratio high, at 33.3.  At that time, potassium level was slightly low, and this could be the reason why aldosterone was only slightly above 10.  In 03/2020, a salt suppression test confirmed hyper aldosterone production (aldosterone level was 14.5, normal lower than 12 mcg per 24 hours) in the setting of an elevated urine sodium of more than 200 mmol per 24 hours.  In 04/2020, an adrenal CT scan did not show any masses and we discussed at this visit that the production of aldosterone could still be unilateral, but it is more likely to be bilateral.  The only way to tell exactly is by checking an adrenal venous sampling test. If the test localizes to a particular adrenal, he is a candidate for adrenalectomy.  We discussed that if the aldosterone hypersecretion did not localize to one of the adrenals, we would most likely need a mineralocorticoid receptor blocker (spironolactone or eplerenone) in addition to his blood pressure regimen.  In 04/2020, we ruled out glucocorticoid-remediable hyperaldosteronism (GRA) by doing a dexamethasone suppression test which showed a suppressed cortisol level but the nonsuppressed aldosterone level, of 13, in the morning after dexamethasone.  Interestingly, he told me that after he took the dexamethasone several nights ago, he felt excellent, the best he felt in a long time.  In 06/2020, he had adrenal vein sampling (AVS) by Dr. Fredia SorrowYamagata.  The results are not completely clear, therefore, I asked the patient to  return to see me today to review the results together.  Since last visit, however, approximately 3 weeks ago, eplerenone 25 mg daily was sent to his pharmacy by my colleague in my absence.  He continues on this medication  today. -I discussed over the phone with Dr. Fredia Sorrow palpitations AVS results.  It appears that the adrenal veins were correctly cannulated since this is likely the index was higher than 2 for both arteries.  His pre- ACTH stimulation lateralization index is 12.5, which would indicate lateralization to the right adrenal vein.  However, the results obtained post- ACTH stimulation are inconclusive since cortisol was only reported as >100 and not with a distinct value, which makes interpretation of the aldosterone/cortisol ratios impossible.  Therefore, we will need to ignore the post-ACTH stimulation values for now, even though this would be a more accurate analysis compared to the pre-ACTH stimulation values.  If we only look at the pre-ACTH stimulation values, the aldosterone production from left adrenal vein is not suppressed, as would have been expected... Therefore, there are several questions about the AVS results which I cannot answer.  We discussed about our options now: 1. Continue eplerenone which we can continue to titrate 2. Referral to an academic center e.g [facility] for second opinion 3.  Right adrenalectomy, which I would not be comfortable to recommend for him right now since I cannot affirm without the shadow of the doubt that the right adrenal is the culprit for his elevated aldosterone secretion, however, we can repeat his CT scan in several years and if an anatomical lesion is evident at that time, this option can be reconsidered -For now, he is option is to continue eplerenone but also agrees with a referral to [facility] for second opinion - I will see him back in 3 weeks for blood pressure management  2. HTN -Patient continues on a complex antihypertensive regimen including: HCTZ, clonidine, doxazosin, hydralazine and amlodipine.  Also, he is now on eplerenone 25 mg daily added 3 weeks ago. - At today's visit, blood pressure is 148/90 -We will increase the eplerenone to 25 mg twice a day. -I  advised him to buy a blood pressure machine at home and start checking his blood pressure.  He needs to let me know about his blood pressures in a week after increasing the dose of eplerenone.  If this improves, we can start decreasing the doses of clonidine and hydralazine, which are causing him side effects. -We will recheck his potassium level at next visit.  Carlus Pavlov, MD PhD [facility]

## 2020-09-12 ENCOUNTER — Other Ambulatory Visit: Payer: Self-pay

## 2020-09-12 ENCOUNTER — Ambulatory Visit (INDEPENDENT_AMBULATORY_CARE_PROVIDER_SITE_OTHER): Payer: BC Managed Care – PPO | Admitting: Internal Medicine

## 2020-09-12 ENCOUNTER — Encounter: Payer: Self-pay | Admitting: Internal Medicine

## 2020-09-12 VITALS — BP 140/90 | HR 92 | Ht 74.0 in | Wt 308.0 lb

## 2020-09-12 DIAGNOSIS — I152 Hypertension secondary to endocrine disorders: Secondary | ICD-10-CM | POA: Diagnosis not present

## 2020-09-12 DIAGNOSIS — E269 Hyperaldosteronism, unspecified: Secondary | ICD-10-CM | POA: Diagnosis not present

## 2020-09-12 LAB — BASIC METABOLIC PANEL
BUN: 13 mg/dL (ref 6–23)
CO2: 26 mEq/L (ref 19–32)
Calcium: 9.5 mg/dL (ref 8.4–10.5)
Chloride: 100 mEq/L (ref 96–112)
Creatinine, Ser: 1.2 mg/dL (ref 0.40–1.50)
GFR: 79.11 mL/min (ref >60.00)
Glucose, Bld: 101 mg/dL — ABNORMAL HIGH (ref 70–99)
Potassium: 3.4 mEq/L — ABNORMAL LOW (ref 3.5–5.1)
Sodium: 135 mEq/L (ref 135–145)

## 2020-09-12 MED ORDER — EPLERENONE 50 MG PO TABS: 25.0000 mg | ORAL_TABLET | Freq: Two times a day (BID) | ORAL | 3 refills | Status: DC

## 2020-09-12 NOTE — Progress Notes (Signed)
Patient ID: Andre Dean, male   DOB: 03/29/1976, 44 y.o.   MRN: [id number]   This visit occurred during the SARS-CoV-2 public health emergency.  Safety protocols were in place, including screening questions prior to the visit, additional usage of staff PPE, and extensive cleaning of exam room while observing appropriate contact time as indicated for disinfecting solutions.   HPI  Andre AzureLance Cleary is a 44 y.o.-year-old male, referred by his PCP, Dr. 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  Last visit 3 weeks ago.  He was diagnosed with hypertension in his 1920s.  Reviewed previous blood pressure levels: BP Readings from Last 3 Encounters:  08/18/20 (!) 148/90  07/13/20 129/75  05/26/20 120/86  01/21/2020: 192/121  Reviewed his medication regimen: At our first visit, he was on the following antihypertensive regimen:  Lisinopril 40 mg daily  Clonidine 0.2 mg 2x a day: ~4:30 to 5 PM when he arrives home from work, and at bedtime.  He cannot take this in the morning because of fatigue.  Hydralazine 100 mg 3x a day  HCTZ 25 mg daily  Amlodipine 10 mg daily - added 12/2018  To be able to better investigate his hyperaldosteronism, we changed his regimen.  We changed to:  Clonidine 0.2 mg 1-2x daily (he can only take it once a day when he is working due to significant fatigue)  HCTZ 25 mg daily  Cardura 4 mg daily  Hydralazine 100 mg 3x a day  Amlodipine 10 mg daily  On 07/25/2020:  Eplerenone 25 mg daily - was added by my colleague  On 08/18/2020:  Eplerenone increased to 25 mg 2x a day  He does have side effects to the above regimen -He feels that hydralazine causes exacerbation of his allergies -Clonidine is causing him significant fatigue  He has a history of hypokalemia and taking potassium 10 mg daily.   His potassium level was low: Lab Results  Component Value Date   K 2.9 (L) 07/13/2020   K 3.4 (L)  03/15/2020   K 3.4 (L) 09/04/2018   K 3.2 (L) 03/05/2018  02/22/2020: potassium 3.6 (3.5-5.3).  Received labs from 06/15/2020 from PCP: BMP normal with exception of a low potassium of 3.0 (3.5-5.3) Low magnesium at 1.4 (1.5-2.5) High CK at 730 (44-196)  He does not have a history of hypotensive spells, headaches, palpitations, chest pain, blurry vision.  Investigation for pheochromocytoma was negative: 01/11/2019: Plasma metanephrines 38 (0-62), plasma normetanephrine 75 (0-175)  Investigation for Cushing syndrome was negative: 01/11/2019: 24-hour urine cortisol 15 (5-64)  However, further investigation pointed towards primary hyperaldosteronism: He had an increased aldosterone/PRA level: 02/22/2020: Aldosterone 5 (0-30), PRA 0.62, aldosterone/renin ratio 8.1 01/11/2019: Aldosterone 13.5 (0-30), PRA <0.167, aldosterone/renin ratio >81.4  5 months ago we rechecked his aldosterone and PRA and the ratio was still abnormal: Component     Latest Ref Rng & Units 03/15/2020  ALDOSTERONE     * ng/dL 11  Renin Activity     0.25 - 5.82 ng/mL/h 0.33  ALDO / PRA Ratio     0.9 - 28.9 Ratio 33.3 (H)  Potassium     3.5 - 5.1 mEq/L 3.4 (L)   After the above results returned, we proceeded with confirmatory test for hyperaldosteronism: P.o. saline suppression:   - At the end of 3 days of p.o. salt tablets, his 24-hour urine aldosterone was higher than 12 mcg per 24 hours in the setting of inappropriately elevated urine sodium, higher than 200  mmol per 24 hours.  Therefore, the test was positive for primary hyperaldosteronism:  Component     Latest Ref Rng & Units 04/17/2020  Sodium/Creat Ratio     30 - 180 mmol/g creat 108  Sodium, 24H Ur     52 - 380 mmol/24 h 333  Creatinine, 24H Ur     0.50 - 2.15 g/24 h 3.08 (H)  Volume, Urine-ALDU     mL 2,850  Aldosterone, 24H Ur     mcg/24 h 14.5  Creatinine, Urine mg/day-VMAUR     0.50 - 2.15 g/24 h 3.06 (H)   We then checked an adrenal CT scan  (05/09/2020) and this was negative for adrenal masses: 1. No acute findings within the abdomen. No adrenal mass identified. 2. Small nonspecific pulmonary nodules are identified within the lung bases. No follow-up needed if patient is low-risk (and has no known or suspected primary neoplasm). Non-contrast chest CT can be considered in 12 months if patient is high-risk. This recommendation follows the consensus statement: Guidelines for Management of Incidental Pulmonary Nodules Detected on CT Images: From the Fleischner Society 2017; Radiology 2017; 284:228-243.  We next checked him for glucocorticoid-remediable hyperaldosteronism (GRA).  For this, we checked a dexamethasone suppression test.  Cortisol level was low after dexamethasone: Cortisol after dexamethasone returned low, confirming steroid suppression: Component     Latest Ref Rng & Units 05/24/2020  Cortisol - AM     mcg/dL 0.9 (L)   However, his aldosterone was not suppressed after dexamethasone, ruling out GRA: Component     Latest Ref Rng & Units 05/24/2020  ALDOSTERONE     ng/dL 13   No history of hypo or hyperthyroidism: 01/11/2019: TSH 3.54 No results found for: TSH   No history of diabetes or prediabetes: No results found for: HGBA1C   No history of hyperparathyroidism or hypercalcemia: No results found for: PTH  Lab Results  Component Value Date   CALCIUM 9.0 07/13/2020   CALCIUM 9.8 09/04/2018   CALCIUM 8.5 (L) 03/05/2018   He does not use stimulants, street drugs, steroids, NSAIDs, but he does use Xolair for asthma.  However, he is smoking.  Of note, he had a normal pharmaceutical stress test on 08/22/2016.  He has a history of OSA.  He had turbinate surgery in 02/2020.  He has an extensive family history of uncontrolled hypertension in mother and maternal grandmother.  ROS: Constitutional: no weight gain/no weight loss, + fatigue, no subjective hyperthermia, no subjective hypothermia, + nocturia Eyes: no  blurry vision, no xerophthalmia ENT: no sore throat, no nodules palpated in neck, no dysphagia, no odynophagia, no hoarseness Cardiovascular: no CP/no SOB/no palpitations/no leg swelling Respiratory: no cough/no SOB/no wheezing Gastrointestinal: no N/no V/no D/no C/no acid reflux Musculoskeletal: no muscle aches/no joint aches Skin: no rashes, no hair loss Neurological: no tremors/no numbness/no tingling/+ dizziness  I reviewed pt's medications, allergies, PMH, social hx, family hx, and changes were documented in the history of present illness. Otherwise, unchanged from my initial visit note.  Past Medical History:  Diagnosis Date  . Asthma   . Hypertension   . Sinus congestion    Past Surgical History:  Procedure Laterality Date  . IR RADIOLOGIST EVAL & MGMT  06/21/2020  . IR US GUIDE VASC ACCESS RIGHT  07/13/2020  . IR VENOCAVAGRAM IVC  07/13/2020  . IR VENOGRAM ADRENAL BI  07/13/2020  . IR VENOGRAM RENAL UNI LEFT  07/13/2020  . IR VENOUS SAMPLING  07/13/2020  . IR VENOUS  SAMPLING  07/13/2020   Social History   Socioeconomic History  . Marital status: Single    Spouse name: Not on file  . Number of children: 1: 17 y/o in 01/2020  . Years of education: Not on file  . Highest education level: Not on file  Occupational History  . Not on file  Tobacco Use  . Smoking status: Current Every Day Smoker    Packs/day: 1/2    Types: Cigarettes  . Smokeless tobacco: Never Used  Substance and Sexual Activity  . Alcohol use: Yes    Comment: occasional, 2 drinks a week  . Drug use: No  . Sexual activity: Not on file  Other Topics Concern  . Not on file  Social History Narrative  . Not on file   Social Determinants of Health   Financial Resource Strain:   . Difficulty of Paying Living Expenses: Not on file  Food Insecurity:   . Worried About Programme researcher, broadcasting/film/video in the Last Year: Not on file  . Ran Out of Food in the Last Year: Not on file  Transportation Needs:   . Lack of  Transportation (Medical): Not on file  . Lack of Transportation (Non-Medical): Not on file  Physical Activity:   . Days of Exercise per Week: Not on file  . Minutes of Exercise per Session: Not on file  Stress:   . Feeling of Stress : Not on file  Social Connections:   . Frequency of Communication with Friends and Family: Not on file  . Frequency of Social Gatherings with Friends and Family: Not on file  . Attends Religious Services: Not on file  . Active Member of Clubs or Organizations: Not on file  . Attends Banker Meetings: Not on file  . Marital Status: Not on file  Intimate Partner Violence:   . Fear of Current or Ex-Partner: Not on file  . Emotionally Abused: Not on file  . Physically Abused: Not on file  . Sexually Abused: Not on file   Current Outpatient Medications on File Prior to Visit  Medication Sig Dispense Refill  . acetaminophen (TYLENOL) 500 MG tablet Take 1,000 mg by mouth every 6 (six) hours as needed for mild pain.    Marland Kitchen albuterol (PROAIR HFA) 108 (90 Base) MCG/ACT inhaler Inhale 1-2 puffs into the lungs every 4 (four) hours as needed for wheezing.     . cloNIDine (CATAPRES) 0.2 MG tablet Take 0.2 mg by mouth daily.     Marland Kitchen doxazosin (CARDURA) 4 MG tablet TAKE 1 TABLET (4 MG TOTAL) BY MOUTH DAILY. 90 tablet 1  . eplerenone (INSPRA) 25 MG tablet Take 1 tablet (25 mg total) by mouth daily. 180 tablet 3  . hydrALAZINE (APRESOLINE) 100 MG tablet Take 1 tablet (100 mg total) by mouth 2 (two) times daily. 270 tablet 3  . hydrochlorothiazide (HYDRODIURIL) 25 MG tablet Take 1 tablet (25 mg total) by mouth daily. 90 tablet 3  . magnesium oxide (MAG-OX) 400 MG tablet Take 400 mg by mouth 2 (two) times daily.    . potassium chloride (MICRO-K) 10 MEQ CR capsule Take 10 mEq by mouth daily.      No current facility-administered medications on file prior to visit.   No Known Allergies No family history on file.   PE: BP 140/90   Pulse 92   Ht 6\' 2"  (1.88 m)    Wt (!) 308 lb (139.7 kg)   SpO2 97%   BMI 39.54 kg/m  Wt Readings from Last 3 Encounters:  09/12/20 (!) 308 lb (139.7 kg)  08/18/20 (!) 303 lb (137.4 kg)  07/13/20 280 lb (127 kg)   Constitutional: overweight, in NAD Eyes: PERRLA, EOMI, no exophthalmos ENT: moist mucous membranes, no thyromegaly, no cervical lymphadenopathy Cardiovascular: RRR, No MRG Respiratory: CTA B Gastrointestinal: abdomen soft, NT, ND, BS+ Musculoskeletal: no deformities, strength intact in all 4 Skin: moist, warm, no rashes Neurological: no tremor with outstretched hands, DTR normal in all 4  ASSESSMENT: 1.  Hyperaldosteronism  2.  Endocrine hypertension  PLAN: 1.   Primary hyperaldosteronism -Patient with longstanding uncontrolled hypertension, on 5 different antihypertensive medications, diagnosed with primary hyperaldosteronism at previous visits.  For more details, please review my note from 08/18/2020. -At last visit, continued eplerenone but we increased the dose -Also, at last visit I referred him at [facility] for a second opinion about management of his primary hyperaldosteronism.  He was called about this but he has to return the call and schedule the actual appointment  2. HTN -Patient continues on a complex antihypertensive regimen including HCTZ, clonidine, doxazosin, hydralazine, and amlodipine.  Also, 3 weeks prior to our last visit eplerenone 25 mg daily was added.  At last visit his blood pressure was still above target, at 149/90.  I advised him to increase the eplerenone to 25 mg twice a day.  I also advised him to buy a blood pressure machine and start checking his blood pressure at home.  We discussed that if his blood pressure starts improving, we can decrease the dose of clonidine and hydralazine, which are causing him side effects. -At this point, I advised him to stop smoking.  She is smoking half a pack a day. -We also discussed about reducing stress at work.  He had a particularly  stressful day today. -At today's visit, blood pressure is only slightly lower, at 140/90 -She was not able to start checking blood pressure at home, but ordered a tensiometer and will start checking -advised to write measurements down -He is tolerating eplerenone well -We will continue to increase the dose >> to 50 mg twice a day.  We can continue to increase the dose further, if needed, based on blood pressure and potassium level -We also need to check potassium level today.  We may need to stop his potassium supplement.  Component     Latest Ref Rng & Units 09/12/2020  Sodium     135 - 145 mEq/L 135  Potassium     3.5 - 5.1 mEq/L 3.4 (L)  Chloride     96 - 112 mEq/L 100  CO2     19 - 32 mEq/L 26  Glucose     70 - 99 mg/dL 119 (H)  BUN     6 - 23 mg/dL 13  Creatinine     4.17 - 1.50 mg/dL 4.08  GFR     >14.48 mL/min 79.11  Calcium     8.4 - 10.5 mg/dL 9.5   Potassium now only slightly under the lower limit of normal.  We will continue with the plan to increase eplerenone and will continue his potassium supplement.  Carlus Pavlov, MD PhD [facility] Endocrinology

## 2020-09-12 NOTE — Patient Instructions (Addendum)
Please stop at the lab.  Please increase eplerenone to 50 mg twice a day.  Please come in for another visit in 1 month.

## 2020-09-14 DIAGNOSIS — J4540 Moderate persistent asthma, uncomplicated: Secondary | ICD-10-CM | POA: Diagnosis not present

## 2020-09-20 DIAGNOSIS — R252 Cramp and spasm: Secondary | ICD-10-CM | POA: Diagnosis not present

## 2020-09-20 DIAGNOSIS — I10 Essential (primary) hypertension: Secondary | ICD-10-CM | POA: Diagnosis not present

## 2020-09-20 DIAGNOSIS — J3089 Other allergic rhinitis: Secondary | ICD-10-CM | POA: Diagnosis not present

## 2020-09-20 DIAGNOSIS — J4540 Moderate persistent asthma, uncomplicated: Secondary | ICD-10-CM | POA: Diagnosis not present

## 2020-10-10 ENCOUNTER — Other Ambulatory Visit: Payer: Self-pay

## 2020-10-10 ENCOUNTER — Ambulatory Visit (INDEPENDENT_AMBULATORY_CARE_PROVIDER_SITE_OTHER): Payer: BC Managed Care – PPO | Admitting: Internal Medicine

## 2020-10-10 ENCOUNTER — Encounter: Payer: Self-pay | Admitting: Internal Medicine

## 2020-10-10 VITALS — BP 142/90 | HR 78 | Ht 74.0 in | Wt 300.8 lb

## 2020-10-10 DIAGNOSIS — I152 Hypertension secondary to endocrine disorders: Secondary | ICD-10-CM | POA: Diagnosis not present

## 2020-10-10 DIAGNOSIS — R5382 Chronic fatigue, unspecified: Secondary | ICD-10-CM

## 2020-10-10 DIAGNOSIS — E269 Hyperaldosteronism, unspecified: Secondary | ICD-10-CM

## 2020-10-10 MED ORDER — EPLERENONE 50 MG PO TABS
100.0000 mg | ORAL_TABLET | Freq: Two times a day (BID) | ORAL | 3 refills | Status: DC
Start: 2020-10-10 — End: 2021-08-20

## 2020-10-10 NOTE — Progress Notes (Signed)
Patient ID: Andre Dean, male   DOB: 1976-07-09, 44 y.o.   MRN: [id number]   This visit occurred during the SARS-CoV-2 public health emergency.  Safety protocols were in place, including screening questions prior to the visit, additional usage of staff PPE, and extensive cleaning of exam room while observing appropriate contact time as indicated for disinfecting solutions.   HPI  Andre Dean is a 44 y.o.-year-old male, initially referred by his PCP, Dr. 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  Last visit 1 month ago.  He was diagnosed with hypertension in his 28s.  Reviewed most recent blood pressure levels: BP Readings from Last 3 Encounters:  09/12/20 140/90  08/18/20 (!) 148/90  07/13/20 129/75  01/21/2020: 192/121  He obtained a BP cuff since last OV, but still did not start checking his BP at home...  Reviewed his medication regimen: At our first visit, he was on the following antihypertensive regimen:  Lisinopril 40 mg daily  Clonidine 0.2 mg 2x a day: ~4:30 to 5 PM when he arrives home from work, and at bedtime.  He cannot take this in the morning because of fatigue.  Hydralazine 100 mg 3x a day  HCTZ 25 mg daily  Amlodipine 10 mg daily - added 12/2018  To be able to better investigate his hyperaldosteronism, we changed his regimen.  We changed to:  Clonidine 0.2 mg 1-2x daily (he can only take it once a day at bedtime when he is working due to significant fatigue)  HCTZ 25 mg daily  Cardura 4 mg daily  Hydralazine 100 mg 3x a day  Amlodipine 10 mg daily  On 07/25/2020:  Eplerenone 25 mg daily - was added by my colleague  On 08/18/2020:  Eplerenone increased to 25 mg 2x a day  On 09/12/2020:  Eplerenone increased to 50 mg 2x a day  He does have side effects to the above regimen: -Hydralazine causes exacerbation of his allergies -Clonidine causes significant fatigue   He has a history  of hypokalemia and taking potassium 10 mEq daily.  His potassium level was low: Lab Results  Component Value Date   K 3.4 (L) 09/12/2020   K 2.9 (L) 07/13/2020   K 3.4 (L) 03/15/2020   K 3.4 (L) 09/04/2018   K 3.2 (L) 03/05/2018  02/22/2020: potassium 3.6 (3.5-5.3).  Received labs from 06/15/2020 from PCP: BMP normal with exception of a low potassium of 3.0 (3.5-5.3) Low magnesium at 1.4 (1.5-2.5) High CK at 730 (44-196)  He denies history of hypertensive spells, headaches, palpitations, chest pain, blurry vision.  Investigation for pheochromocytoma was negative: 01/11/2019: Plasma metanephrines 38 (0-62), plasma normetanephrine 75 (0-175)  Investigation for Cushing syndrome was negative: 01/11/2019: 24-hour urine cortisol 15 (5-64)  However, further investigation pointed towards primary hyperaldosteronism: He had an increased aldosterone/PRA level: 02/22/2020: Aldosterone 5 (0-30), PRA 0.62, aldosterone/renin ratio 8.1 01/11/2019: Aldosterone 13.5 (0-30), PRA <0.167, aldosterone/renin ratio >81.4  5 months ago we rechecked his aldosterone and PRA and the ratio was still abnormal: Component     Latest Ref Rng & Units 03/15/2020  ALDOSTERONE     * ng/dL 11  Renin Activity     0.25 - 5.82 ng/mL/h 0.33  ALDO / PRA Ratio     0.9 - 28.9 Ratio 33.3 (H)  Potassium     3.5 - 5.1 mEq/L 3.4 (L)   After the above results returned, we proceeded with confirmatory test for hyperaldosteronism: P.o. saline suppression:   -  At the end of 3 days of p.o. salt tablets, his 24-hour urine aldosterone was higher than 12 mcg per 24 hours in the setting of inappropriately elevated urine sodium, higher than 200 mmol per 24 hours.  Therefore, the test was positive for primary hyperaldosteronism:  Component     Latest Ref Rng & Units 04/17/2020  Sodium/Creat Ratio     30 - 180 mmol/g creat 108  Sodium, 24H Ur     52 - 380 mmol/24 h 333  Creatinine, 24H Ur     0.50 - 2.15 g/24 h 3.08 (H)  Volume,  Urine-ALDU     mL 2,850  Aldosterone, 24H Ur     mcg/24 h 14.5  Creatinine, Urine mg/day-VMAUR     0.50 - 2.15 g/24 h 3.06 (H)   We then checked an adrenal CT scan (05/09/2020) and this was negative for adrenal masses: 1. No acute findings within the abdomen. No adrenal mass identified. 2. Small nonspecific pulmonary nodules are identified within the lung bases. No follow-up needed if patient is low-risk (and has no known or suspected primary neoplasm). Non-contrast chest CT can be considered in 12 months if patient is high-risk. This recommendation follows the consensus statement: Guidelines for Management of Incidental Pulmonary Nodules Detected on CT Images: From the Fleischner Society 2017; Radiology 2017; 284:228-243.  We next checked him for glucocorticoid-remediable hyperaldosteronism (GRA).  For this, we checked a dexamethasone suppression test.  Cortisol level was low after dexamethasone: Cortisol after dexamethasone returned low, confirming steroid suppression: Component     Latest Ref Rng & Units 05/24/2020  Cortisol - AM     mcg/dL 0.9 (L)   However, his aldosterone was not suppressed after dexamethasone, ruling out GRA: Component     Latest Ref Rng & Units 05/24/2020  ALDOSTERONE     ng/dL 13   No hypo or hyperthyroidism. 01/11/2019: TSH 3.54 No results found for: TSH   No history of diabetes or prediabetes: No results found for: HGBA1C   No history of hyperparathyroidism or hypercalcemia: No results found for: PTH  Lab Results  Component Value Date   CALCIUM 9.5 09/12/2020   CALCIUM 9.0 07/13/2020   CALCIUM 9.8 09/04/2018   CALCIUM 8.5 (L) 03/05/2018   He is not using any stimulants, street drugs, steroids, NSAIDs, but does use Xolair for asthma.  He is smoking, though, trying to cut back.  Of note, he had a normal pharmaceutical stress test on 08/22/2016.  He has a history of OSA.  He had turbinate surgery in 02/2020.  He has an extensive family history  of uncontrolled hypertension in mother and maternal grandmother.  ROS: Constitutional: no weight gain/no weight loss, + fatigue, no subjective hyperthermia, no subjective hypothermia, + nocturia Eyes: no blurry vision, no xerophthalmia ENT: no sore throat, no nodules palpated in neck, no dysphagia, no odynophagia, no hoarseness Cardiovascular: no CP/no SOB/no palpitations/no leg swelling Respiratory: no cough/no SOB/no wheezing Gastrointestinal: no N/no V/no D/no C/no acid reflux Musculoskeletal: no muscle aches/no joint aches Skin: no rashes, no hair loss Neurological: no tremors/no numbness/no tingling/+ dizziness  I reviewed pt's medications, allergies, PMH, social hx, family hx, and changes were documented in the history of present illness. Otherwise, unchanged from my initial visit note.   Past Medical History:  Diagnosis Date  . Asthma   . Hypertension   . Sinus congestion    Past Surgical History:  Procedure Laterality Date  . IR RADIOLOGIST EVAL & MGMT  06/21/2020  . IR [place]  GUIDE VASC ACCESS RIGHT  07/13/2020  . IR VENOCAVAGRAM IVC  07/13/2020  . IR VENOGRAM ADRENAL BI  07/13/2020  . IR VENOGRAM RENAL UNI LEFT  07/13/2020  . IR VENOUS SAMPLING  07/13/2020  . IR VENOUS SAMPLING  07/13/2020   Social History   Socioeconomic History  . Marital status: Single    Spouse name: Not on file  . Number of children: 1: 38 y/o in 01/2020  . Years of education: Not on file  . Highest education level: Not on file  Occupational History  . Not on file  Tobacco Use  . Smoking status: Current Every Day Smoker    Packs/day: 1/2    Types: Cigarettes  . Smokeless tobacco: Never Used  Substance and Sexual Activity  . Alcohol use: Yes    Comment: occasional, 2 drinks a week  . Drug use: No  . Sexual activity: Not on file  Other Topics Concern  . Not on file  Social History Narrative  . Not on file   Social Determinants of Health   Financial Resource Strain:   . Difficulty of  Paying Living Expenses: Not on file  Food Insecurity:   . Worried About Programme researcher, broadcasting/film/video in the Last Year: Not on file  . Ran Out of Food in the Last Year: Not on file  Transportation Needs:   . Lack of Transportation (Medical): Not on file  . Lack of Transportation (Non-Medical): Not on file  Physical Activity:   . Days of Exercise per Week: Not on file  . Minutes of Exercise per Session: Not on file  Stress:   . Feeling of Stress : Not on file  Social Connections:   . Frequency of Communication with Friends and Family: Not on file  . Frequency of Social Gatherings with Friends and Family: Not on file  . Attends Religious Services: Not on file  . Active Member of Clubs or Organizations: Not on file  . Attends Banker Meetings: Not on file  . Marital Status: Not on file  Intimate Partner Violence:   . Fear of Current or Ex-Partner: Not on file  . Emotionally Abused: Not on file  . Physically Abused: Not on file  . Sexually Abused: Not on file   Current Outpatient Medications on File Prior to Visit  Medication Sig Dispense Refill  . acetaminophen (TYLENOL) 500 MG tablet Take 1,000 mg by mouth every 6 (six) hours as needed for mild pain.    Marland Kitchen albuterol (PROAIR HFA) 108 (90 Base) MCG/ACT inhaler Inhale 1-2 puffs into the lungs every 4 (four) hours as needed for wheezing.     . cloNIDine (CATAPRES) 0.2 MG tablet Take 0.2 mg by mouth daily.     Marland Kitchen doxazosin (CARDURA) 4 MG tablet TAKE 1 TABLET (4 MG TOTAL) BY MOUTH DAILY. 90 tablet 1  . eplerenone (INSPRA) 50 MG tablet Take 0.5 tablets (25 mg total) by mouth 2 (two) times daily. 180 tablet 3  . hydrALAZINE (APRESOLINE) 100 MG tablet Take 1 tablet (100 mg total) by mouth 2 (two) times daily. 270 tablet 3  . hydrochlorothiazide (HYDRODIURIL) 25 MG tablet Take 1 tablet (25 mg total) by mouth daily. 90 tablet 3  . magnesium oxide (MAG-OX) 400 MG tablet Take 400 mg by mouth 2 (two) times daily.    . potassium chloride (MICRO-K)  10 MEQ CR capsule Take 10 mEq by mouth daily.      No current facility-administered medications on file prior to  visit.   No Known Allergies No family history on file.   PE: BP (!) 142/90   Pulse 78   Ht  (1.88 m)   Wt (!) 300 lb 12.8 oz (136.4 kg)   BMI 38.62 kg/m  Wt Readings from Last 3 Encounters:  10/10/20 (!) 300 lb 12.8 oz (136.4 kg)  09/12/20 (!) 308 lb (139.7 kg)  08/18/20 (!) 303 lb (137.4 kg)   Constitutional: overweight, in NAD Eyes: PERRLA, EOMI, no exophthalmos ENT: moist mucous membranes, no thyromegaly, no cervical lymphadenopathy Cardiovascular: RRR, No MRG Respiratory: CTA B Gastrointestinal: abdomen soft, NT, ND, BS+ Musculoskeletal: no deformities, strength intact in all 4 Skin: moist, warm, no rashes Neurological: no tremor with outstretched hands, DTR normal in all 4  ASSESSMENT: 1.  Hyperaldosteronism  2.  Endocrine hypertension  3. Fatigue  PLAN: 1.   Primary hyperaldosteronism -Patient with longstanding, uncontrolled, hypertension, on several different antihypertensive medications, diagnosed with primary hyperaldosteronism at previous visits.  For more details, please review my note from 08/18/2020. -At last visit, we continued eplerenone and increased the dose -After his diagnosis of hyperaldosteronism and his adrenal vein sampling results returned, I referred him to [facility] for second opinion about management of his PA.  He was called about this but did not return the call to schedule the actual appointment yet.  2. HTN -Patient continues on a complex antihypertensive regimen including HCTZ, clonidine, doxazosin, hydralazine, and amlodipine.  Also, eplerenone 25 mg daily was added this summer.  At last visit, on 25 mg twice a day, his blood pressure was still elevated, only slightly improved from before, at 140/90.  However, he had a particularly stressful day at the time of our last appointment.  His potassium level was still slightly low on the  above regimen and also on potassium supplements 10 mEq daily. -I did advise him to start checking his blood pressure at home and advised him to buy a blood pressure cuff.  He is still did not start checking it, although, he did obtain the machine. -In the clinic today, blood pressure is 142/90 - he did not take his midday hydralazine dose -I advised him to increase the eplerenone dose to 100 mg twice a day. -We will also check his potassium level.  I do expect that we may be able to stop his potassium supplement soon. -I would like to see him back in 1.5 months, but I strongly advised him to send me his blood pressures so we can make medication adjustments before appointments.  If blood pressures improve at home call I would first suggest to eliminate the clonidine, since this is making him very tired.  3. Fatigue - also has numbness in fingers - will check a B12 vitamin level per his request  Component     Latest Ref Rng & Units 10/10/2020  Sodium     135 - 145 mEq/L 133 (L)  Potassium     3.5 - 5.1 mEq/L 3.6  Chloride     96 - 112 mEq/L 100  CO2     19 - 32 mEq/L 24  Glucose     70 - 99 mg/dL 99  BUN     6 - 23 mg/dL 10  Creatinine     5.40 - 1.50 mg/dL 9.81  GFR     >19.14 mL/min 83.15  Calcium     8.4 - 10.5 mg/dL 9.2  Vitamin N82     956 - 911 pg/mL 184 (L)  Vit B12 low >> will suggest im injections once a week for 4 weeks then once a month for 6 months. Sodium slightly low. Potassium normal now >> since we are increasing eplerenone, I will advise him to stop the potassium supplement. I would like to repeat the BMP in 2 weeks.   Carlus Pavlov, MD PhD [facility] End[facility]

## 2020-10-10 NOTE — Patient Instructions (Signed)
Please increase: - Eplerenone to 75 mg 2x a day for 3 days, then 100 mg 2x a day  Please stop at the lab.  Please return in 1.5 months with the BP log.

## 2020-10-11 ENCOUNTER — Encounter: Payer: Self-pay | Admitting: Internal Medicine

## 2020-10-11 LAB — BASIC METABOLIC PANEL
BUN: 10 mg/dL (ref 6–23)
CO2: 24 mEq/L (ref 19–32)
Calcium: 9.2 mg/dL (ref 8.4–10.5)
Chloride: 100 mEq/L (ref 96–112)
Creatinine, Ser: 1.07 mg/dL (ref 0.40–1.50)
GFR: 83.15 mL/min (ref >60.00)
Glucose, Bld: 99 mg/dL (ref 70–99)
Potassium: 3.6 mEq/L (ref 3.5–5.1)
Sodium: 133 mEq/L — ABNORMAL LOW (ref 135–145)

## 2020-10-11 LAB — VITAMIN B12: Vitamin B-12: 184 pg/mL — ABNORMAL LOW (ref 211–911)

## 2020-10-12 ENCOUNTER — Encounter: Payer: Self-pay | Admitting: Internal Medicine

## 2020-10-13 DIAGNOSIS — E538 Deficiency of other specified B group vitamins: Secondary | ICD-10-CM | POA: Diagnosis not present

## 2020-10-18 DIAGNOSIS — J4540 Moderate persistent asthma, uncomplicated: Secondary | ICD-10-CM | POA: Diagnosis not present

## 2020-10-20 DIAGNOSIS — E538 Deficiency of other specified B group vitamins: Secondary | ICD-10-CM | POA: Diagnosis not present

## 2020-10-20 DIAGNOSIS — R252 Cramp and spasm: Secondary | ICD-10-CM | POA: Diagnosis not present

## 2020-10-24 ENCOUNTER — Encounter: Payer: Self-pay | Admitting: Internal Medicine

## 2020-10-25 ENCOUNTER — Other Ambulatory Visit: Payer: Self-pay

## 2020-10-25 ENCOUNTER — Other Ambulatory Visit (INDEPENDENT_AMBULATORY_CARE_PROVIDER_SITE_OTHER): Payer: BC Managed Care – PPO

## 2020-10-25 ENCOUNTER — Encounter: Payer: Self-pay | Admitting: Internal Medicine

## 2020-10-25 DIAGNOSIS — I152 Hypertension secondary to endocrine disorders: Secondary | ICD-10-CM

## 2020-10-25 DIAGNOSIS — Z136 Encounter for screening for cardiovascular disorders: Secondary | ICD-10-CM | POA: Diagnosis not present

## 2020-10-25 DIAGNOSIS — Z0001 Encounter for general adult medical examination with abnormal findings: Secondary | ICD-10-CM | POA: Diagnosis not present

## 2020-10-25 DIAGNOSIS — R252 Cramp and spasm: Secondary | ICD-10-CM | POA: Diagnosis not present

## 2020-10-25 LAB — BASIC METABOLIC PANEL
BUN: 9 mg/dL (ref 6–23)
CO2: 27 mEq/L (ref 19–32)
Calcium: 9.7 mg/dL (ref 8.4–10.5)
Chloride: 100 mEq/L (ref 96–112)
Creatinine, Ser: 1.08 mg/dL (ref 0.40–1.50)
GFR: 82.92 mL/min (ref >60.00)
Glucose, Bld: 98 mg/dL (ref 70–99)
Potassium: 3.6 mEq/L (ref 3.5–5.1)
Sodium: 134 mEq/L — ABNORMAL LOW (ref 135–145)

## 2020-10-27 ENCOUNTER — Other Ambulatory Visit: Payer: BC Managed Care – PPO

## 2020-10-27 DIAGNOSIS — E538 Deficiency of other specified B group vitamins: Secondary | ICD-10-CM | POA: Diagnosis not present

## 2020-11-01 DIAGNOSIS — E538 Deficiency of other specified B group vitamins: Secondary | ICD-10-CM | POA: Diagnosis not present

## 2020-11-16 DIAGNOSIS — E538 Deficiency of other specified B group vitamins: Secondary | ICD-10-CM | POA: Diagnosis not present

## 2020-11-30 DIAGNOSIS — J4540 Moderate persistent asthma, uncomplicated: Secondary | ICD-10-CM | POA: Diagnosis not present

## 2020-12-05 ENCOUNTER — Other Ambulatory Visit: Payer: Self-pay

## 2020-12-05 ENCOUNTER — Ambulatory Visit (INDEPENDENT_AMBULATORY_CARE_PROVIDER_SITE_OTHER): Payer: BC Managed Care – PPO | Admitting: Internal Medicine

## 2020-12-05 ENCOUNTER — Encounter: Payer: Self-pay | Admitting: Internal Medicine

## 2020-12-05 VITALS — BP 120/80 | HR 100 | Ht 74.0 in | Wt 306.8 lb

## 2020-12-05 DIAGNOSIS — I152 Hypertension secondary to endocrine disorders: Secondary | ICD-10-CM

## 2020-12-05 DIAGNOSIS — E269 Hyperaldosteronism, unspecified: Secondary | ICD-10-CM

## 2020-12-05 DIAGNOSIS — E538 Deficiency of other specified B group vitamins: Secondary | ICD-10-CM

## 2020-12-05 DIAGNOSIS — E871 Hypo-osmolality and hyponatremia: Secondary | ICD-10-CM | POA: Diagnosis not present

## 2020-12-05 NOTE — Patient Instructions (Addendum)
Please continue: - Eplerenone 100 mg 2x a day  Continue the rest of your blood pressure medications, except:. - Decrease Hydralazine to only 100 mg at bedtime  Please continue to stay off potassium supplements.    Continue on B12 injections every month.  Please schedule a nurse visit in ~ 1 week and also a lab appt.  Please return in 1.5 months with the BP log.

## 2020-12-05 NOTE — Progress Notes (Signed)
Patient ID: Andre Dean, male   DOB: 1976-05-22, 44 y.o.   MRN: [id number]   This visit occurred during the SARS-CoV-2 public health emergency.  Safety protocols were in place, including screening questions prior to the visit, additional usage of staff PPE, and extensive cleaning of exam room while observing appropriate contact time as indicated for disinfecting solutions.   HPI  Andre Dean is a 44 y.o.-year-old male, initially referred by his PCP, Dr. 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  Last visit 1.5 months ago.  He was diagnosed with hypertension in his 45s.  Reviewed most recent blood pressure levels: BP Readings from Last 3 Encounters:  10/10/20 (!) 142/90  09/12/20 140/90  08/18/20 (!) 148/90  01/21/2020: 192/121  At last visit, he just obtained a blood pressure cuff but did not start checking his blood pressures at home yet...  Reviewed and addended his medication regimen: At our first visit, he was on the following antihypertensive regimen:  Lisinopril 40 mg daily  Clonidine 0.2 mg 2x a day: ~4:30 to 5 PM when he arrives home from work, and at bedtime.  He cannot take this in the morning because of fatigue.  Hydralazine 100 mg 3x a day  HCTZ 25 mg daily  Amlodipine 10 mg daily - added 12/2018  To be able to better investigate his hyperaldosteronism, we changed his regimen.  We changed to:  Clonidine 0.2 mg 1-2x daily (he can only take it once a day at bedtime when he is working due to significant fatigue)  HCTZ 25 mg daily  Cardura 4 mg daily  Hydralazine 100 mg 3 >> 2x a day  Amlodipine 10 mg daily  On 07/25/2020:  Eplerenone 25 mg daily - was added by my colleague  On 08/18/2020:  Eplerenone increased to 25 mg 2x a day  On 09/12/2020:  Eplerenone increased to 50 mg 2x a day  On 10/10/2020:  Eplerenone increased to 100 mg 2x a day  Potassium stopped  He does have side  effects from the above regimen: -Hydralazine causes exacerbation of allergies and also fatigue if he takes it midday -Clonidine causes significant fatigue  He has a history of hypokalemia and was taking potassium 10 mEq daily until 10/10/2020 when we stopped it.  Reviewed potassium levels: Lab Results  Component Value Date   K 3.6 10/25/2020   K 3.6 10/10/2020   K 3.4 (L) 09/12/2020   K 2.9 (L) 07/13/2020   K 3.4 (L) 03/15/2020   K 3.4 (L) 09/04/2018   K 3.2 (L) 03/05/2018  02/22/2020: potassium 3.6 (3.5-5.3).  Received labs from 06/15/2020 from PCP: BMP normal with exception of a low potassium of 3.0 (3.5-5.3) Low magnesium at 1.4 (1.5-2.5) High CK at 730 (44-196)  He denies a history of hypotensive spells, headaches, palpitations, chest pain, blurry vision.  Investigation for pheochromocytoma was negative: 01/11/2019: Plasma metanephrines 38 (0-62), plasma normetanephrine 75 (0-175)  Investigation for Cushing syndrome was negative: 01/11/2019: 24-hour urine cortisol 15 (5-64)  However, further investigation pointing towards primary hyperaldosteronism: He had an increased aldosterone/PRA level: 02/22/2020: Aldosterone 5 (0-30), PRA 0.62, aldosterone/renin ratio 8.1 01/11/2019: Aldosterone 13.5 (0-30), PRA <0.167, aldosterone/renin ratio >81.4  5 months ago we rechecked his aldosterone and PRA and the ratio was still abnormal: Component     Latest Ref Rng & Units 03/15/2020  ALDOSTERONE     * ng/dL 11  Renin Activity     0.25 - 5.82 ng/mL/h 0.33  ALDO / PRA Ratio     0.9 - 28.9 Ratio 33.3 (H)  Potassium     3.5 - 5.1 mEq/L 3.4 (L)   After the above results returned, we proceeded with confirmatory test for hyperaldosteronism: P.o. saline suppression:   - At the end of 3 days of p.o. salt tablets, his 24-hour urine aldosterone was higher than 12 mcg per 24 hours in the setting of inappropriately elevated urine sodium, higher than 200 mmol per 24 hours.  Therefore, the test was  positive for primary hyperaldosteronism:  Component     Latest Ref Rng & Units 04/17/2020  Sodium/Creat Ratio     30 - 180 mmol/g creat 108  Sodium, 24H Ur     52 - 380 mmol/24 h 333  Creatinine, 24H Ur     0.50 - 2.15 g/24 h 3.08 (H)  Volume, Urine-ALDU     mL 2,850  Aldosterone, 24H Ur     mcg/24 h 14.5  Creatinine, Urine mg/day-VMAUR     0.50 - 2.15 g/24 h 3.06 (H)   We then checked an adrenal CT scan (05/09/2020) and this was negative for adrenal masses: 1. No acute findings within the abdomen. No adrenal mass identified. 2. Small nonspecific pulmonary nodules are identified within the lung bases. No follow-up needed if patient is low-risk (and has no known or suspected primary neoplasm). Non-contrast chest CT can be considered in 12 months if patient is high-risk. This recommendation follows the consensus statement: Guidelines for Management of Incidental Pulmonary Nodules Detected on CT Images: From the Fleischner Society 2017; Radiology 2017; 284:228-243.  We next checked him for glucocorticoid-remediable hyperaldosteronism (GRA).  For this, we checked a dexamethasone suppression test.  Cortisol level was low after dexamethasone: Cortisol after dexamethasone returned low, confirming steroid suppression: Component     Latest Ref Rng & Units 05/24/2020  Cortisol - AM     mcg/dL 0.9 (L)   However, his aldosterone was not suppressed after dexamethasone, ruling out GRA: Component     Latest Ref Rng & Units 05/24/2020  ALDOSTERONE     ng/dL 13   An adrenal venous sampling test was inconclusive, unfortunately, due to the cortisol assay (did not report actual values - see below).  Unfortunately, he has a co-pay of $6000 for this test.  I discussed over the phone with Dr. Fredia Dean about patient's AVS results.  It appears that the adrenal veins were correctly cannulated since the selectivity index was higher than 2 for both arteries.  His pre- ACTH stimulation lateralization index  is 12.5, which would indicate lateralization to the right adrenal vein.  However, the results obtained post- ACTH stimulation are inconclusive since cortisol was only reported as >100 and not with a distinct value, which makes interpretation of the aldosterone/cortisol ratios impossible.  Therefore, we will need to ignore the post-ACTH stimulation values for now, even though this would be a more accurate analysis compared to the pre-ACTH stimulation values.  If we only look at the pre-ACTH stimulation values, the aldosterone production from left adrenal vein is not suppressed, as would have been expected...   No history of hyper or hypothyroidism 01/11/2019: TSH 3.54 No results found for: TSH   No history of prediabetes or diabetes No results found for: HGBA1C   No history of hyperparathyroidism or hypercalcemia No results found for: PTH  Lab Results  Component Value Date   CALCIUM 9.7 10/25/2020   CALCIUM 9.2 10/10/2020   CALCIUM 9.5 09/12/2020   CALCIUM 9.0  07/13/2020   CALCIUM 9.8 09/04/2018   CALCIUM 8.5 (L) 03/05/2018   He is not using any stimulants, street drugs, steroids, NSAID.he is using Xolair for asthma.  He is trying to cut back on smoking.  Of note, he had a normal pharmaceutical stress test on 08/22/2016.  He has a history of OSA.  He had turbinate surgery in 02/2020.  He has an extensive family history of uncontrolled hypertension in mother and maternal grandmother.  At last visit, he complained of fatigue and we checked a B12 level.  This returned low: Lab Results  Component Value Date   VITAMINB12 184 (L) 10/10/2020   We started B12 inj's 1x a week x 4 weeks >> now monthly. He feels much better on this.  He also has was found to have a low vitamin D at last visit with PCP >> now on Ergocalciferol 50,000 units weekly.  We also started him on magnesium for mm cramps.  He takes this twice a day, but unclear how much.  ROS: Constitutional: no weight gain/no weight  loss, + fatigue, no subjective hyperthermia, no subjective hypothermia, + nocturia Eyes: no blurry vision, no xerophthalmia ENT: no sore throat, no nodules palpated in neck, no dysphagia, no odynophagia, no hoarseness Cardiovascular: no CP/no SOB/no palpitations/no leg swelling Respiratory: no cough/no SOB/no wheezing Gastrointestinal: no N/no V/no D/no C/no acid reflux Musculoskeletal: + Muscle aches/no joint aches Skin: no rashes, no hair loss Neurological: no tremors/no numbness/no tingling/no dizziness  I reviewed pt's medications, allergies, PMH, social hx, family hx, and changes were documented in the history of present illness. Otherwise, unchanged from my initial visit note.   Past Medical History:  Diagnosis Date  . Asthma   . Hypertension   . Sinus congestion    Past Surgical History:  Procedure Laterality Date  . IR RADIOLOGIST EVAL & MGMT  06/21/2020  . IR US GUIDE VASC ACCESS RIGHT  07/13/2020  . IR VENOCAVAGRAM IVC  07/13/2020  . IR VENOGRAM ADRENAL BI  07/13/2020  . IR VENOGRAM RENAL UNI LEFT  07/13/2020  . IR VENOUS SAMPLING  07/13/2020  . IR VENOUS SAMPLING  07/13/2020   Social History   Socioeconomic History  . Marital status: Single    Spouse name: Not on file  . Number of children: 1: 31 y/o in 01/2020  . Years of education: Not on file  . Highest education level: Not on file  Occupational History  . Not on file  Tobacco Use  . Smoking status: Current Every Day Smoker    Packs/day: 1/2    Types: Cigarettes  . Smokeless tobacco: Never Used  Substance and Sexual Activity  . Alcohol use: Yes    Comment: occasional, 2 drinks a week  . Drug use: No  . Sexual activity: Not on file  Other Topics Concern  . Not on file  Social History Narrative  . Not on file   Social Determinants of Health   Financial Resource Strain:   . Difficulty of Paying Living Expenses: Not on file  Food Insecurity:   . Worried About Programme researcher, broadcasting/film/video in the Last Year: Not on  file  . Ran Out of Food in the Last Year: Not on file  Transportation Needs:   . Lack of Transportation (Medical): Not on file  . Lack of Transportation (Non-Medical): Not on file  Physical Activity:   . Days of Exercise per Week: Not on file  . Minutes of Exercise per Session: Not on file  Stress:   . Feeling of Stress : Not on file  Social Connections:   . Frequency of Communication with Friends and Family: Not on file  . Frequency of Social Gatherings with Friends and Family: Not on file  . Attends Religious Services: Not on file  . Active Member of Clubs or Organizations: Not on file  . Attends Banker Meetings: Not on file  . Marital Status: Not on file  Intimate Partner Violence:   . Fear of Current or Ex-Partner: Not on file  . Emotionally Abused: Not on file  . Physically Abused: Not on file  . Sexually Abused: Not on file   Current Outpatient Medications on File Prior to Visit  Medication Sig Dispense Refill  . acetaminophen (TYLENOL) 500 MG tablet Take 1,000 mg by mouth every 6 (six) hours as needed for mild pain.    Marland Kitchen albuterol (PROAIR HFA) 108 (90 Base) MCG/ACT inhaler Inhale 1-2 puffs into the lungs every 4 (four) hours as needed for wheezing.     . cloNIDine (CATAPRES) 0.2 MG tablet Take 0.2 mg by mouth daily.     Marland Kitchen doxazosin (CARDURA) 4 MG tablet TAKE 1 TABLET (4 MG TOTAL) BY MOUTH DAILY. 90 tablet 1  . eplerenone (INSPRA) 50 MG tablet Take 2 tablets (100 mg total) by mouth 2 (two) times daily. 360 tablet 3  . hydrALAZINE (APRESOLINE) 100 MG tablet Take 1 tablet (100 mg total) by mouth 2 (two) times daily. 270 tablet 3  . hydrochlorothiazide (HYDRODIURIL) 25 MG tablet Take 1 tablet (25 mg total) by mouth daily. 90 tablet 3  . potassium chloride (MICRO-K) 10 MEQ CR capsule Take 10 mEq by mouth daily.      No current facility-administered medications on file prior to visit.   No Known Allergies No family history on file.   PE: BP 120/80   Pulse 100    Ht  (1.88 m)   Wt (!) 306 lb 12.8 oz (139.2 kg)   SpO2 96%   BMI 39.39 kg/m  Wt Readings from Last 3 Encounters:  12/05/20 (!) 306 lb 12.8 oz (139.2 kg)  10/10/20 (!) 300 lb 12.8 oz (136.4 kg)  09/12/20 (!) 308 lb (139.7 kg)   Constitutional: overweight, in NAD Eyes: PERRLA, EOMI, no exophthalmos ENT: moist mucous membranes, no thyromegaly, no cervical lymphadenopathy Cardiovascular: tachycardia, RR, No MRG Respiratory: CTA B Gastrointestinal: abdomen soft, NT, ND, BS+ Musculoskeletal: no deformities, strength intact in all 4 Skin: moist, warm, no rashes Neurological: no tremor with outstretched hands, DTR normal in all 4  ASSESSMENT: 1.  Hyperaldosteronism  2.  Endocrine hypertension  3.  B12 deficiency  4.  Hyponatremia -Mild  PLAN: 1.   Primary hyperaldosteronism -Patient with longstanding, uncontrolled, hypertension, on several different antihypertensive medications, diagnosed with primary hyperaldosteronism at her previous visit.  For more details, please review my note from 08/18/2020. -At last visit, we continued eplerenone and increased the dose -After his diagnosis of hyperaldosteronism and his adrenal vein sampling results returned, I referred him to [facility] for second opinion about management of his PA.  He was called about this but did not go for the appointment yet.  2. HTN -Patient continues on a complex antihypertensive regimen including HCTZ, clonidine, doxazosin, hydralazine, and amlodipine.  We added eplerenone 25 mg daily this summer.   The dose was increased to 25 mg twice a day, then 50 mg twice a day and, at last visit, 1200 mg twice a day as blood pressure  was still elevated. -he contacted me after the visit due to muscle cramps >> I suggested to start a magnesium supplement and also multivitamin -Of note, the last 2 checks, his potassium levels were normal.  We stopped his 10 mEq potassium supplement at last visit and level remains normal but we  checked 2 weeks later -At our previous visit I advised him to start checking his blood pressure at home and buy a blood pressure cuff.  At last visit he was still not taking it, although he just obtained the machine. -I did advise him at last visit to get in touch with me after he starts checking his blood pressures at home as I would like to adjust his regimen between appointments and hopefully eliminate the clonidine since this was making him very tired.  However, he did not contact me about his blood pressure since last visit.  At this visit, he tells me that he started to check at home and sugars are usually in the 140s but he feels that his machine is showing higher values than obtained at his visits in the clinic.  I advised him to bring his machine at next blood pressure check visit. -At last visit, blood pressure was 142/90 at last visit (he usually misses his midday hydralazine dose due to side effects).  At today's visit, blood pressure is finally at goal, 120/80. -At today's visit, we will check a BMP level -For now, I advised him to continue eplerenone 100 mg twice a day for now, but I advised him to stop morning hydralazine since he feels that this is the one that causes him more fatigue.   -I advised him to come back for a nurse visit for blood pressure in 1 week.  At that time, if blood pressure is well controlled, patient continue de-escalating his regimen, possibly by stopping his clonidine at night. -I would like to see him back in 1.5 months  3.  B12 deficiency -We checked his due to fatigue and numbness in fingers at last visit -B12 level was low so we started B12 injections -he gets these in PCP office.  We started Let's 1 injection/week for 4 weeks and then will continue monthly for 6 months. -He feels much better after he started B12 replacement  4.  Mild hyponatremia -At last check, his sodium was 133, mildly low.  2 weeks later, this improved to 134: Lab Results  Component  Value Date   NA 134 (L) 10/25/2020   NA 133 (L) 10/10/2020   NA 135 09/12/2020   NA 137 07/13/2020   NA 141 09/04/2018   NA 136 03/05/2018  -Most likely due to his antihypertensive regimen. -Advised him to stay well-hydrated -We will recheck a BMP when he returns for blood pressure check in 1 week.  Carlus Pavlov, MD PhD [facility] Endocrinology

## 2020-12-14 DIAGNOSIS — E559 Vitamin D deficiency, unspecified: Secondary | ICD-10-CM | POA: Diagnosis not present

## 2020-12-14 DIAGNOSIS — Z1211 Encounter for screening for malignant neoplasm of colon: Secondary | ICD-10-CM | POA: Diagnosis not present

## 2020-12-14 DIAGNOSIS — E538 Deficiency of other specified B group vitamins: Secondary | ICD-10-CM | POA: Diagnosis not present

## 2020-12-14 DIAGNOSIS — Z0001 Encounter for general adult medical examination with abnormal findings: Secondary | ICD-10-CM | POA: Diagnosis not present

## 2020-12-14 DIAGNOSIS — J45909 Unspecified asthma, uncomplicated: Secondary | ICD-10-CM | POA: Diagnosis not present

## 2020-12-14 DIAGNOSIS — Z23 Encounter for immunization: Secondary | ICD-10-CM | POA: Diagnosis not present

## 2021-01-12 ENCOUNTER — Encounter: Payer: Self-pay | Admitting: Internal Medicine

## 2021-01-12 ENCOUNTER — Other Ambulatory Visit: Payer: Self-pay

## 2021-01-12 DIAGNOSIS — I152 Hypertension secondary to endocrine disorders: Secondary | ICD-10-CM

## 2021-01-12 DIAGNOSIS — E871 Hypo-osmolality and hyponatremia: Secondary | ICD-10-CM

## 2021-01-12 DIAGNOSIS — E269 Hyperaldosteronism, unspecified: Secondary | ICD-10-CM

## 2021-01-12 DIAGNOSIS — R5382 Chronic fatigue, unspecified: Secondary | ICD-10-CM

## 2021-01-14 IMAGING — XA IR [PERSON_NAME]/ADRENAL BI
6 of 7 series · 10 of 24 positions shown · IV contrast (IODINE)
Comparison: None.
COMPARISON: None.

Addendum:
INDICATION: Evidence clinically primary hyperaldosteronism without imaging
evidence adrenal adenoma or hyperplasia. The patient has been
referred for adrenal vein sampling.

EXAM:
1. ULTRASOUND GUIDANCE FOR VASCULAR ACCESS OF THE RIGHT COMMON
FEMORAL VEIN
2. FOR IVC VENOGRAPHY
3. LEFT RENAL VENOGRAPHY
4. LEFT ADRENAL VENOGRAPHY
5. RIGHT ADRENAL VENOGRAPHY
6. RIGHT ADRENAL VEIN SAMPLING
7. LEFT ADRENAL VEIN SAMPLING
TECHNIQUE: Informed written consent was obtained from the patient after a
thorough discussion of the procedural risks, benefits and
alternatives. All questions were addressed. Maximal Sterile Barrier
Technique was utilized including caps, mask, sterile gowns, sterile
gloves, sterile drape, hand hygiene and skin antiseptic. A timeout
was performed prior to the initiation of the procedure. Prior to the
procedure serum aldosterone and cortisol levels were drawn.

[Series 1: body 4 care · 1 of 5 slices shown (1 of 6)]
[im 5/5]
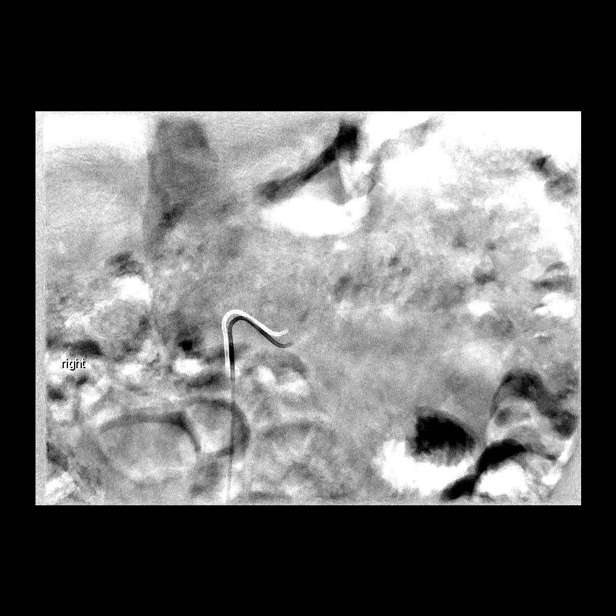

[Series 2: body 4 care · 2 of 11 slices shown (2 of 6)]
[im 3/11]
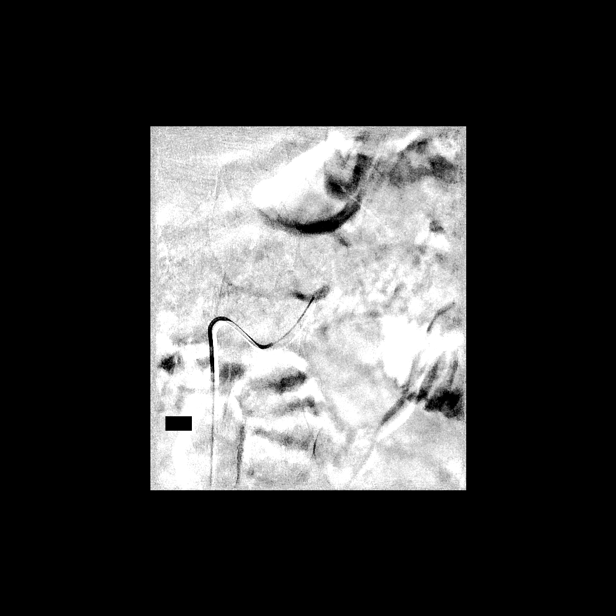
[im 11/11]
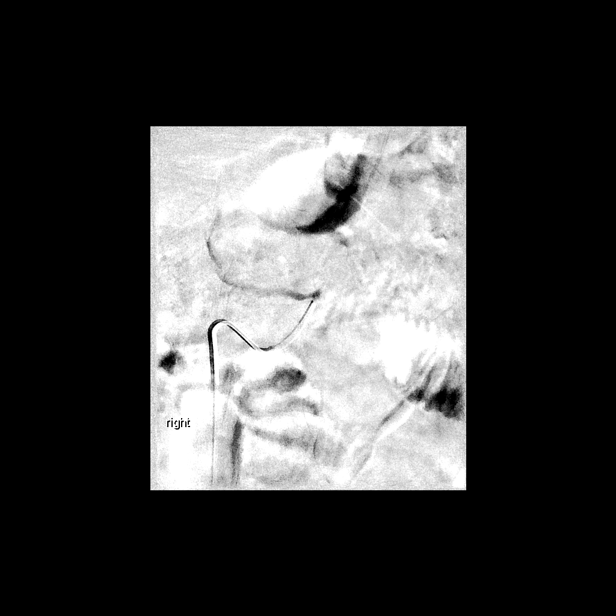

[Series 3: body 4 care · 2 of 9 slices shown (3 of 6)]
[im 3/9]
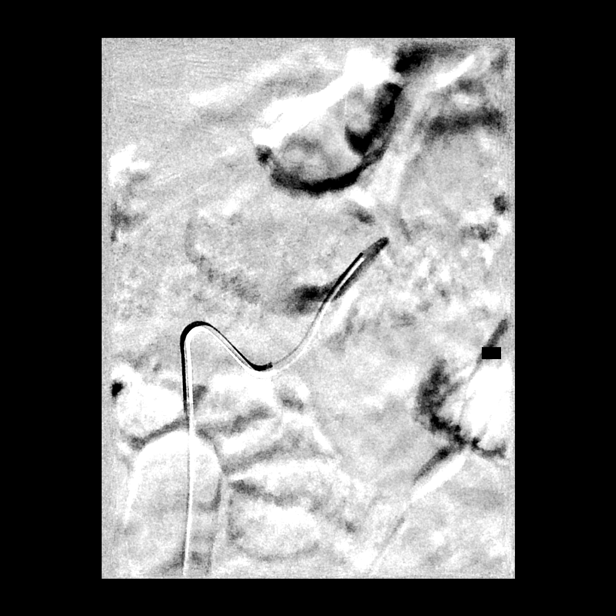
[im 9/9]
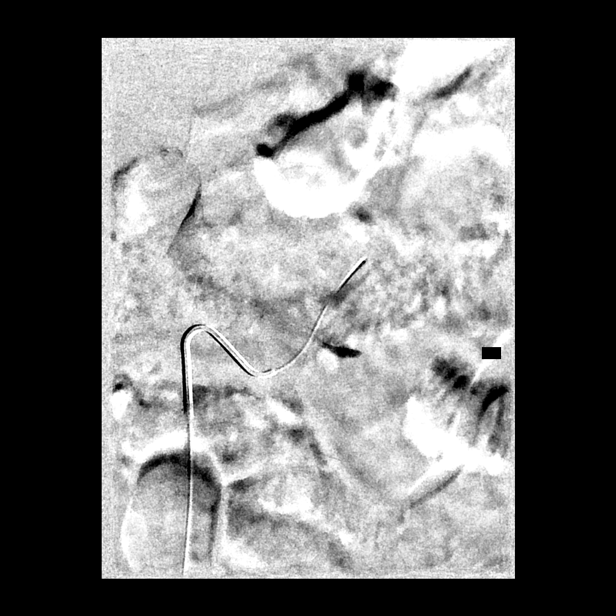

[Series 5: body 4 care · 1 of 5 slices shown (4 of 6)]
[im 3/5]
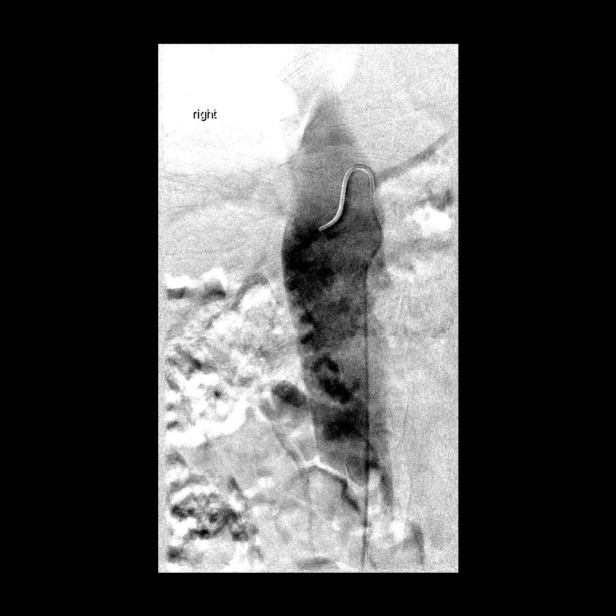

[Series 6: body 4 care · 2 of 9 slices shown (5 of 6)]
[im 1/9]
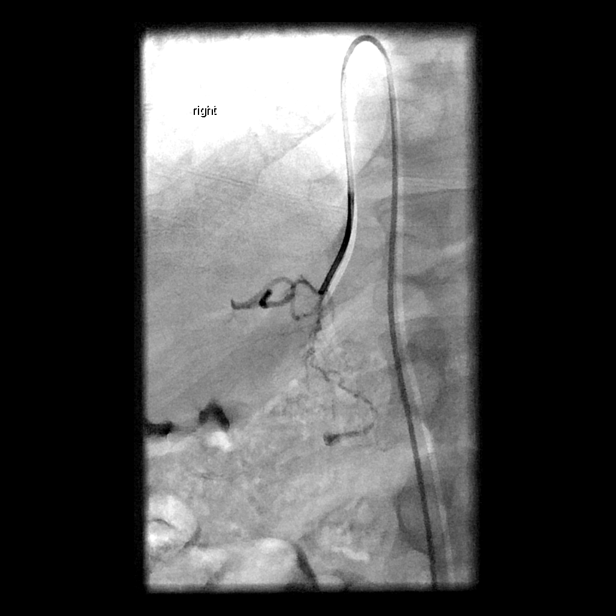
[im 7/9]
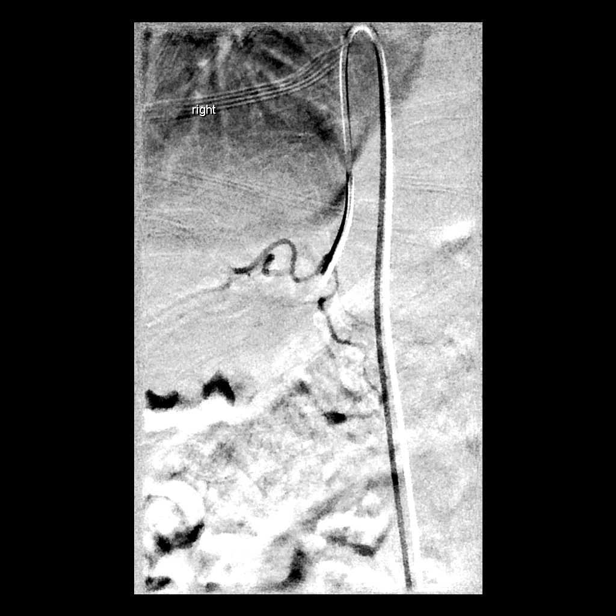

[Series 7: body 4 care · 2 of 7 slices shown (6 of 6)]
[im 1/7]
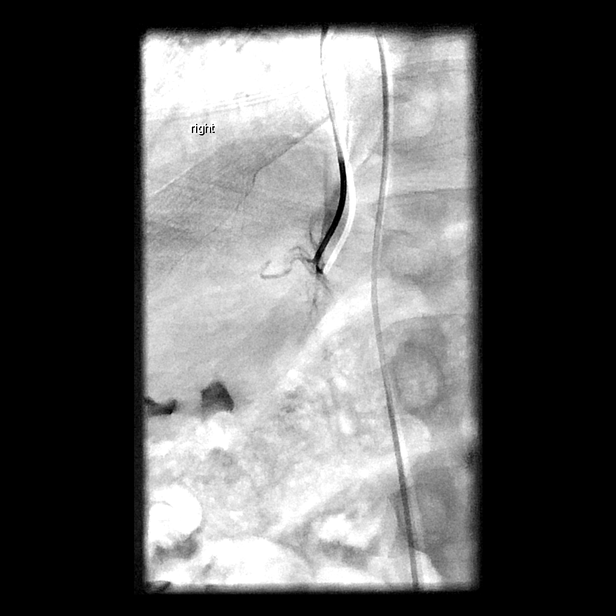
[im 5/7]
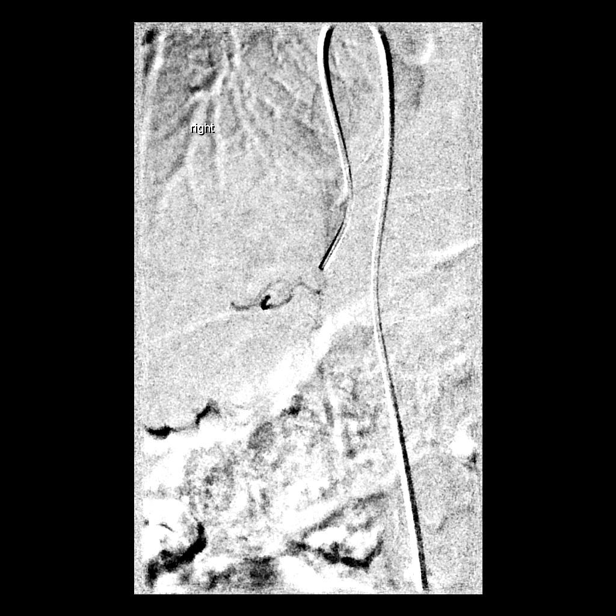

[10 of 24 positions shown; findings below may reference images not displayed]

MEDICATIONS:
150 mcg IV bolus of cosyntropin open during the procedure.

ANESTHESIA/SEDATION:
Versed 1.0 mg IV; Fentanyl 50 mcg IV

Moderate Sedation Time:  91 minutes.

The patient was continuously monitored during the procedure by the
interventional radiology nurse under my direct supervision.

FLUOROSCOPY TIME:  Fluoroscopy Time: 17 minutes and 42 seconds. 889
mGy.

CONTRAST:  48 mL Omnipaque 300

COMPLICATIONS:
None immediate.
The right groin was sterilely prepped. Local anesthesia was provided
with 1% lidocaine. Patency of the right common femoral vein was
confirmed by ultrasound. The right common femoral vein was accessed
under direct ultrasound guidance with a 21 gauge needle and
micropuncture [DATE] French sheath was placed over a guidewire.

A 5 French catheter was advanced into the inferior vena cava and
venous sampling performed at the level of the infrarenal and
suprarenal IVC. All venous samples obtained during the procedure
were sent for aldosterone and cortisol levels. IVC venography was
also performed through a 5 French catheter.

The left renal vein was catheterized with a 5 French catheter and
selective venography performed. Venous sampling was performed at the
level of the left renal vein. A microcatheter was advanced through
the 5 French catheter and used to selectively catheterize the left
adrenal vein. Selective venography of the left adrenal vein was then
performed through the microcatheter. Venous sampling was performed
of the left adrenal vein.

Additional 5 French catheters were utilized in attempting selective
catheterization of the right adrenal vein. After successful
catheterization, selective venography of the right adrenal vein was
performed. Right adrenal vein sampling was then performed through
the catheter.

Adrenal stimulation was performed with infusion of an IV bolus of
150 mcg cosyntropin over several minutes. After infusion was
completed, post stimulation venous sampling was performed with the
IV cosyntropin running at 50 micrograms/hour. Post stimulation
venous sampling was performed at the level of the right adrenal
vein, left renal vein and left adrenal vein.
FINDINGS: Venography at the level of the IVC demonstrates normal patency of
the IVC. Left renal venography demonstrates normal patency of the
left renal vein. The lower aspect of the left adrenal vein was able
to be refluxed during left renal venography. The left adrenal vein
was able to be selectively catheterized with venography
demonstrating additional accessory left adrenal vein drainage
directly into the IVC. The left adrenal vein was sampled in its main
segment towards the adrenal gland.

The right adrenal vein was able to be catheterized with venography
demonstrating typical appearance of adrenal veins. Sampling was
performed at the level of the right adrenal vein orifice.

Once all of the laboratory results are obtained, a separate addendum
will be added to this report to tabulated venous sampling results.
IMPRESSION: Successful adrenal vein sampling procedure which included additional
left renal vein and IVC sampling pre and post stimulation with
cosyntropin. Additional addendum will be added to tabulated venous
sampling results once all of the laboratory values are obtained.

ADDENDUM:
Results from the venous sampling procedure are tabulated below:

Serum
* ALDOSTERONE:
* CORTISOL: 10

Infrarenal IVC
* ALDOSTERONE:
* CORTISOL:

Suprarenal IVC
* ALDOSTERONE:
* CORTISOL:

Left renal vein
* ALDOSTERONE:
* CORTISOL:

Left adrenal vein
* ALDOSTERONE:
* CORTISOL:

Right adrenal vein
* ALDOSTERONE:
* CORTISOL:

Right adrenal vein (post-stimulation)
* ALDOSTERONE:
* CORTISOL: >100

Left renal vein (post-stim)
* ALDOSTERONE:
* CORTISOL:

Left adrenal vein (post-stim)
* ALDOSTERONE: [REDACTED]
* CORTISOL: >100

Pre and post stimulation sampling from the adrenal veins is not
concordant and does not show definite lateralization.

* :
* :

* :
* :

* :
* :

* :
* :

* :
* :

* :
* :

* :
* :

* :
* :

*** End of Addendum ***
MEDICATIONS:
150 mcg IV bolus of cosyntropin open during the procedure.

ANESTHESIA/SEDATION:
Versed 1.0 mg IV; Fentanyl 50 mcg IV

Moderate Sedation Time:  91 minutes.

The patient was continuously monitored during the procedure by the
interventional radiology nurse under my direct supervision.

FLUOROSCOPY TIME:  Fluoroscopy Time: 17 minutes and 42 seconds. 889
mGy.

CONTRAST:  48 mL Omnipaque 300

COMPLICATIONS:
None immediate.
The right groin was sterilely prepped. Local anesthesia was provided
with 1% lidocaine. Patency of the right common femoral vein was
confirmed by ultrasound. The right common femoral vein was accessed
under direct ultrasound guidance with a 21 gauge needle and
micropuncture [DATE] French sheath was placed over a guidewire.

A 5 French catheter was advanced into the inferior vena cava and
venous sampling performed at the level of the infrarenal and
suprarenal IVC. All venous samples obtained during the procedure
were sent for aldosterone and cortisol levels. IVC venography was
also performed through a 5 French catheter.

The left renal vein was catheterized with a 5 French catheter and
selective venography performed. Venous sampling was performed at the
level of the left renal vein. A microcatheter was advanced through
the 5 French catheter and used to selectively catheterize the left
adrenal vein. Selective venography of the left adrenal vein was then
performed through the microcatheter. Venous sampling was performed
of the left adrenal vein.

Additional 5 French catheters were utilized in attempting selective
catheterization of the right adrenal vein. After successful
catheterization, selective venography of the right adrenal vein was
performed. Right adrenal vein sampling was then performed through
the catheter.

Adrenal stimulation was performed with infusion of an IV bolus of
150 mcg cosyntropin over several minutes. After infusion was
completed, post stimulation venous sampling was performed with the
IV cosyntropin running at 50 micrograms/hour. Post stimulation
venous sampling was performed at the level of the right adrenal
vein, left renal vein and left adrenal vein.
FINDINGS: Venography at the level of the IVC demonstrates normal patency of
the IVC. Left renal venography demonstrates normal patency of the
left renal vein. The lower aspect of the left adrenal vein was able
to be refluxed during left renal venography. The left adrenal vein
was able to be selectively catheterized with venography
demonstrating additional accessory left adrenal vein drainage
directly into the IVC. The left adrenal vein was sampled in its main
segment towards the adrenal gland.

The right adrenal vein was able to be catheterized with venography
demonstrating typical appearance of adrenal veins. Sampling was
performed at the level of the right adrenal vein orifice.

Once all of the laboratory results are obtained, a separate addendum
will be added to this report to tabulated venous sampling results.
IMPRESSION: Successful adrenal vein sampling procedure which included additional
left renal vein and IVC sampling pre and post stimulation with
cosyntropin. Additional addendum will be added to tabulated venous
sampling results once all of the laboratory values are obtained.

## 2021-01-16 ENCOUNTER — Other Ambulatory Visit: Payer: BC Managed Care – PPO

## 2021-01-16 ENCOUNTER — Ambulatory Visit: Payer: BC Managed Care – PPO

## 2021-01-16 DIAGNOSIS — E538 Deficiency of other specified B group vitamins: Secondary | ICD-10-CM | POA: Diagnosis not present

## 2021-01-23 ENCOUNTER — Other Ambulatory Visit: Payer: Self-pay | Admitting: Internal Medicine

## 2021-01-24 ENCOUNTER — Other Ambulatory Visit (INDEPENDENT_AMBULATORY_CARE_PROVIDER_SITE_OTHER): Payer: BC Managed Care – PPO

## 2021-01-24 ENCOUNTER — Other Ambulatory Visit: Payer: Self-pay

## 2021-01-24 DIAGNOSIS — E871 Hypo-osmolality and hyponatremia: Secondary | ICD-10-CM

## 2021-01-24 DIAGNOSIS — I152 Hypertension secondary to endocrine disorders: Secondary | ICD-10-CM | POA: Diagnosis not present

## 2021-01-24 DIAGNOSIS — R5382 Chronic fatigue, unspecified: Secondary | ICD-10-CM

## 2021-01-24 DIAGNOSIS — E269 Hyperaldosteronism, unspecified: Secondary | ICD-10-CM | POA: Diagnosis not present

## 2021-01-24 NOTE — Telephone Encounter (Signed)
Ok to refill 

## 2021-01-24 NOTE — Telephone Encounter (Signed)
I am waiting for his BMP to come back and will decide.

## 2021-01-24 NOTE — Addendum Note (Signed)
Addended by: Adline Mango I on: 01/24/2021 03:58 PM   Modules accepted: Orders

## 2021-01-25 ENCOUNTER — Encounter: Payer: Self-pay | Admitting: Internal Medicine

## 2021-01-25 LAB — BASIC METABOLIC PANEL
BUN: 11 mg/dL (ref 7–25)
CO2: 26 mmol/L (ref 20–32)
Calcium: 10 mg/dL (ref 8.6–10.3)
Chloride: 100 mmol/L (ref 98–110)
Creat: 1.07 mg/dL (ref 0.60–1.35)
Glucose, Bld: 100 mg/dL — ABNORMAL HIGH (ref 65–99)
Potassium: 3.7 mmol/L (ref 3.5–5.3)
Sodium: 135 mmol/L (ref 135–146)

## 2021-01-30 ENCOUNTER — Ambulatory Visit: Payer: BC Managed Care – PPO | Admitting: Internal Medicine

## 2021-02-16 ENCOUNTER — Encounter: Payer: Self-pay | Admitting: Internal Medicine

## 2021-02-20 DIAGNOSIS — E538 Deficiency of other specified B group vitamins: Secondary | ICD-10-CM | POA: Diagnosis not present

## 2021-03-26 ENCOUNTER — Encounter: Payer: Self-pay | Admitting: Internal Medicine

## 2021-03-26 ENCOUNTER — Other Ambulatory Visit: Payer: Self-pay

## 2021-03-26 ENCOUNTER — Ambulatory Visit: Payer: BC Managed Care – PPO | Admitting: Internal Medicine

## 2021-03-26 VITALS — BP 128/80 | HR 110 | Ht 74.0 in | Wt 317.4 lb

## 2021-03-26 DIAGNOSIS — E876 Hypokalemia: Secondary | ICD-10-CM

## 2021-03-26 DIAGNOSIS — E538 Deficiency of other specified B group vitamins: Secondary | ICD-10-CM | POA: Diagnosis not present

## 2021-03-26 DIAGNOSIS — E269 Hyperaldosteronism, unspecified: Secondary | ICD-10-CM | POA: Diagnosis not present

## 2021-03-26 DIAGNOSIS — I152 Hypertension secondary to endocrine disorders: Secondary | ICD-10-CM

## 2021-03-26 LAB — BASIC METABOLIC PANEL
BUN: 11 mg/dL (ref 6–23)
CO2: 24 mEq/L (ref 19–32)
Calcium: 9.6 mg/dL (ref 8.4–10.5)
Chloride: 100 mEq/L (ref 96–112)
Creatinine, Ser: 1.1 mg/dL (ref 0.40–1.50)
GFR: 80.87 mL/min (ref >60.00)
Glucose, Bld: 119 mg/dL — ABNORMAL HIGH (ref 70–99)
Potassium: 3.2 mEq/L — ABNORMAL LOW (ref 3.5–5.1)
Sodium: 135 mEq/L (ref 135–145)

## 2021-03-26 LAB — VITAMIN B12: Vitamin B-12: 348 pg/mL (ref 211–911)

## 2021-03-26 NOTE — Patient Instructions (Addendum)
Please continue: - Eplerenone 100 mg 2x a day  Continue the rest of your blood pressure medications, except: Stop Clonidine.  If BP remains controlled in the next week, try to stop Hydralazine.  Please stop at the lab.  Please return in 1.5 months with the BP log.

## 2021-03-26 NOTE — Progress Notes (Signed)
Patient ID: Andre Dean, male   DOB: 1976-07-17, 45 y.o.   MRN: [id number]   This visit occurred during the SARS-CoV-2 public health emergency.  Safety protocols were in place, including screening questions prior to the visit, additional usage of staff PPE, and extensive cleaning of exam room while observing appropriate contact time as indicated for disinfecting solutions.   HPI  Andre Dean is a 45 y.o.-year-old male, initially referred by his PCP, Dr. 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  Last visit 3 months ago.  Interim history: Since last visit, he started to check his blood pressures at home: up to 130 systolic.  However, he forgot his cuff at home. He just bought a treadmill >> plans to start exercising. He continues to have nocturia 1-3 times a night. He also continues to have significant allergies. Stress at work is a little better lately.  He was diagnosed with hypertension in his 11s.  Reviewed most recent blood pressure levels: BP Readings from Last 3 Encounters:  12/05/20 120/80  10/10/20 (!) 142/90  09/12/20 140/90  01/21/2020: 192/121  Reviewed and addended his medication regimen: At our first visit, he was on the following antihypertensive regimen:  Lisinopril 40 mg daily  Clonidine 0.2 mg 2x a day: ~4:30 to 5 PM when he arrives home from work, and at bedtime.  He cannot take this in the morning because of fatigue.  Hydralazine 100 mg 3x a day  HCTZ 25 mg daily  Amlodipine 10 mg daily - added 12/2018  To be able to better investigate his hyperaldosteronism, we changed his regimen.  We changed to:  Clonidine 0.2 mg 1-2x daily (he can only take it once a day at bedtime when he is working due to significant fatigue)  HCTZ 25 mg daily  Cardura 4 mg daily  Hydralazine 100 mg 3 >> 2x a day  Amlodipine 10 mg daily  On 07/25/2020:  Eplerenone 25 mg daily - was added by my  colleague  On 08/18/2020:  Eplerenone increased to 25 mg 2x a day  On 09/12/2020:  Eplerenone increased to 50 mg 2x a day  On 10/10/2020:  Eplerenone increased to 100 mg 2x a day  Potassium stopped  Current regimen:  Clonidine 0.2 mg 1x daily (he can only take it once a day at bedtime when he is working due to significant fatigue)  HCTZ 25 mg daily  Cardura 4 mg daily  Hydralazine 100 mg 1x a day  Amlodipine 10 mg   Eplerenone 100 mg 2x a day  He does have side effects from the above regimen: -Hydralazine causes exacerbation of allergies and also fatigue if he takes it midday >> now only on hydralazine at night -Clonidine causes significant fatigue >> only takes it at bedtime  He has a history of hypokalemia and was taking potassium 10 mEq daily until 10/10/2020 when we stopped it.  Reviewed potassium levels: Lab Results  Component Value Date   K 3.7 01/24/2021   K 3.6 10/25/2020   K 3.6 10/10/2020   K 3.4 (L) 09/12/2020   K 2.9 (L) 07/13/2020   K 3.4 (L) 03/15/2020   K 3.4 (L) 09/04/2018   K 3.2 (L) 03/05/2018  02/22/2020: potassium 3.6 (3.5-5.3).  Received labs from 06/15/2020 from PCP: BMP normal with exception of a low potassium of 3.0 (3.5-5.3) Low magnesium at 1.4 (1.5-2.5) High CK at 730 (44-196)  He denies a history of hypotensive spells, headaches, palpitations, chest  pain, blurry vision.  Investigation for pheochromocytoma was negative: 01/11/2019: Plasma metanephrines 38 (0-62), plasma normetanephrine 75 (0-175)  Investigation for Cushing syndrome was negative: 01/11/2019: 24-hour urine cortisol 15 (5-64)  However, further investigation pointing towards primary hyperaldosteronism: He had an increased aldosterone/PRA level: 02/22/2020: Aldosterone 5 (0-30), PRA 0.62, aldosterone/renin ratio 8.1 01/11/2019: Aldosterone 13.5 (0-30), PRA <0.167, aldosterone/renin ratio >81.4  5 months ago we rechecked his aldosterone and PRA and the ratio was still  abnormal: Component     Latest Ref Rng & Units 03/15/2020  ALDOSTERONE     * ng/dL 11  Renin Activity     0.25 - 5.82 ng/mL/h 0.33  ALDO / PRA Ratio     0.9 - 28.9 Ratio 33.3 (H)  Potassium     3.5 - 5.1 mEq/L 3.4 (L)   After the above results returned, we proceeded with confirmatory test for hyperaldosteronism: P.o. saline suppression:   - At the end of 3 days of p.o. salt tablets, his 24-hour urine aldosterone was higher than 12 mcg per 24 hours in the setting of inappropriately elevated urine sodium, higher than 200 mmol per 24 hours.  Therefore, the test was positive for primary hyperaldosteronism:  Component     Latest Ref Rng & Units 04/17/2020  Sodium/Creat Ratio     30 - 180 mmol/g creat 108  Sodium, 24H Ur     52 - 380 mmol/24 h 333  Creatinine, 24H Ur     0.50 - 2.15 g/24 h 3.08 (H)  Volume, Urine-ALDU     mL 2,850  Aldosterone, 24H Ur     mcg/24 h 14.5  Creatinine, Urine mg/day-VMAUR     0.50 - 2.15 g/24 h 3.06 (H)   We then checked an adrenal CT scan (05/09/2020) and this was negative for adrenal masses: 1. No acute findings within the abdomen. No adrenal mass identified. 2. Small nonspecific pulmonary nodules are identified within the lung bases. No follow-up needed if patient is low-risk (and has no known or suspected primary neoplasm). Non-contrast chest CT can be considered in 12 months if patient is high-risk. This recommendation follows the consensus statement: Guidelines for Management of Incidental Pulmonary Nodules Detected on CT Images: From the Fleischner Society 2017; Radiology 2017; 284:228-243.  We next checked him for glucocorticoid-remediable hyperaldosteronism (GRA).  For this, we checked a dexamethasone suppression test.  Cortisol level was low after dexamethasone: Cortisol after dexamethasone returned low, confirming steroid suppression: Component     Latest Ref Rng & Units 05/24/2020  Cortisol - AM     mcg/dL 0.9 (L)   However, his aldosterone  was not suppressed after dexamethasone, ruling out GRA: Component     Latest Ref Rng & Units 05/24/2020  ALDOSTERONE     ng/dL 13   An adrenal venous sampling test was inconclusive, unfortunately, due to the cortisol assay (did not report actual values - see below).  Unfortunately, he has a co-pay of $6000 for this test.  I discussed over the phone with Dr. Fredia Sorrow about patient's AVS results.  It appears that the adrenal veins were correctly cannulated since the selectivity index was higher than 2 for both arteries.  His pre- ACTH stimulation lateralization index is 12.5, which would indicate lateralization to the right adrenal vein.  However, the results obtained post- ACTH stimulation are inconclusive since cortisol was only reported as >100 and not with a distinct value, which makes interpretation of the aldosterone/cortisol ratios impossible.  Therefore, we will need to ignore the post-ACTH  stimulation values for now, even though this would be a more accurate analysis compared to the pre-ACTH stimulation values.  If we only look at the pre-ACTH stimulation values, the aldosterone production from left adrenal vein is not suppressed, as would have been expected...   No history of hyper or hypothyroidism 01/11/2019: TSH 3.54 No results found for: TSH   No history of prediabetes or diabetes No results found for: HGBA1C   No history of hyperparathyroidism or hypercalcemia No results found for: PTH  Lab Results  Component Value Date   CALCIUM 10.0 01/24/2021   CALCIUM 9.7 10/25/2020   CALCIUM 9.2 10/10/2020   CALCIUM 9.5 09/12/2020   CALCIUM 9.0 07/13/2020   CALCIUM 9.8 09/04/2018   CALCIUM 8.5 (L) 03/05/2018   Kidney function is normal: Lab Results  Component Value Date   BUN 11 03/26/2021   BUN 11 01/24/2021   BUN 9 10/25/2020   Lab Results  Component Value Date   CREATININE 1.10 03/26/2021   CREATININE 1.07 01/24/2021   CREATININE 1.08 10/25/2020   Lab Results  Component  Value Date   GFRAA >60 07/13/2020   GFRAA >60 09/04/2018   GFRAA >60 03/05/2018   He is not using any stimulants, street drugs, steroids, NSAID. He is using Xolair for asthma.  He is trying to cut back on smoking.  Of note, he had a normal pharmaceutical stress test on 08/22/2016.  He has a history of OSA.  He had turbinate surgery in 02/2020.  He has an extensive family history of uncontrolled hypertension in mother and maternal grandmother.  At last visit, he complained of fatigue and we checked a B12 level.  This returned low: Lab Results  Component Value Date   VITAMINB12 184 (L) 10/10/2020   We started B12 inj's 1x a week x 4 weeks >> now monthly. He feels much better on this.  He also has was found to have a low vitamin D at last visit with PCP >> now on Ergocalciferol 50,000 units weekly.  We also started him on magnesium for mm cramps.  These have improved.  ROS: Constitutional: no weight gain/no weight loss, + fatigue, no subjective hyperthermia, no subjective hypothermia, + nocturia Eyes: no blurry vision, no xerophthalmia ENT: no sore throat, no nodules palpated in neck, no dysphagia, no odynophagia, no hoarseness Cardiovascular: no CP/no SOB/no palpitations/no leg swelling Respiratory: no cough/no SOB/no wheezing Gastrointestinal: no N/no V/no D/no C/no acid reflux Musculoskeletal: no Muscle aches/no joint aches Skin: no rashes, no hair loss Neurological: no tremors/no numbness/no tingling/no dizziness  I reviewed pt's medications, allergies, PMH, social hx, family hx, and changes were documented in the history of present illness. Otherwise, unchanged from my initial visit note.   Past Medical History:  Diagnosis Date  . Asthma   . Hypertension   . Sinus congestion    Past Surgical History:  Procedure Laterality Date  . IR RADIOLOGIST EVAL & MGMT  06/21/2020  . IR US GUIDE VASC ACCESS RIGHT  07/13/2020  . IR VENOCAVAGRAM IVC  07/13/2020  . IR VENOGRAM ADRENAL  BI  07/13/2020  . IR VENOGRAM RENAL UNI LEFT  07/13/2020  . IR VENOUS SAMPLING  07/13/2020  . IR VENOUS SAMPLING  07/13/2020   Social History   Socioeconomic History  . Marital status: Single    Spouse name: Not on file  . Number of children: 1: 63 y/o in 01/2020  . Years of education: Not on file  . Highest education level: Not on file  Occupational History  . Not on file  Tobacco Use  . Smoking status: Current Every Day Smoker    Packs/day: 1/2    Types: Cigarettes  . Smokeless tobacco: Never Used  Substance and Sexual Activity  . Alcohol use: Yes    Comment: occasional, 2 drinks a week  . Drug use: No  . Sexual activity: Not on file  Other Topics Concern  . Not on file  Social History Narrative  . Not on file   Social Determinants of Health   Financial Resource Strain:   . Difficulty of Paying Living Expenses: Not on file  Food Insecurity:   . Worried About Programme researcher, broadcasting/film/videounning Out of Food in the Last Year: Not on file  . Ran Out of Food in the Last Year: Not on file  Transportation Needs:   . Lack of Transportation (Medical): Not on file  . Lack of Transportation (Non-Medical): Not on file  Physical Activity:   . Days of Exercise per Week: Not on file  . Minutes of Exercise per Session: Not on file  Stress:   . Feeling of Stress : Not on file  Social Connections:   . Frequency of Communication with Friends and Family: Not on file  . Frequency of Social Gatherings with Friends and Family: Not on file  . Attends Religious Services: Not on file  . Active Member of Clubs or Organizations: Not on file  . Attends BankerClub or Organization Meetings: Not on file  . Marital Status: Not on file  Intimate Partner Violence:   . Fear of Current or Ex-Partner: Not on file  . Emotionally Abused: Not on file  . Physically Abused: Not on file  . Sexually Abused: Not on file   Current Outpatient Medications on File Prior to Visit  Medication Sig Dispense Refill  . acetaminophen (TYLENOL) 500  MG tablet Take 1,000 mg by mouth every 6 (six) hours as needed for mild pain.    Marland Kitchen. albuterol (PROAIR HFA) 108 (90 Base) MCG/ACT inhaler Inhale 1-2 puffs into the lungs every 4 (four) hours as needed for wheezing.     . cloNIDine (CATAPRES) 0.2 MG tablet Take 0.2 mg by mouth daily.     Marland Kitchen. doxazosin (CARDURA) 4 MG tablet TAKE 1 TABLET (4 MG TOTAL) BY MOUTH DAILY. 90 tablet 1  . eplerenone (INSPRA) 50 MG tablet Take 2 tablets (100 mg total) by mouth 2 (two) times daily. 360 tablet 3  . hydrALAZINE (APRESOLINE) 100 MG tablet Take 1 tablet (100 mg total) by mouth 2 (two) times daily. 270 tablet 3  . hydrochlorothiazide (HYDRODIURIL) 25 MG tablet Take 1 tablet (25 mg total) by mouth daily. 90 tablet 3  . potassium chloride (MICRO-K) 10 MEQ CR capsule Take 10 mEq by mouth daily.      No current facility-administered medications on file prior to visit.   No Known Allergies No family history on file.   PE: BP 128/80 (BP Location: Left Arm, Patient Position: Sitting, Cuff Size: Large)   Pulse (!) 110   Ht 6\' 2"  (1.88 m)   Wt (!) 317 lb 6.4 oz (144 kg)   SpO2 98%   BMI 40.75 kg/m  Wt Readings from Last 3 Encounters:  03/26/21 (!) 317 lb 6.4 oz (144 kg)  12/05/20 (!) 306 lb 12.8 oz (139.2 kg)  10/10/20 (!) 300 lb 12.8 oz (136.4 kg)   Constitutional: overweight, in NAD Eyes: PERRLA, EOMI, no exophthalmos ENT: moist mucous membranes, no thyromegaly, no cervical lymphadenopathy Cardiovascular:  tachycardia, RR, No MRG Respiratory: CTA B Gastrointestinal: abdomen soft, NT, ND, BS+ Musculoskeletal: no deformities, strength intact in all 4 Skin: moist, warm, no rashes Neurological: + tremor with outstretched hands, DTR normal in all 4  ASSESSMENT: 1.  Hyperaldosteronism  2.  Endocrine hypertension  3.  B12 deficiency  PLAN: 1.   Primary hyperaldosteronism -Patient with longstanding, uncontrolled, hypertension, on several different antihypertensive medications, diagnosed with primary  hyperaldosteronism at her previous visit.  For more details, please review my note from 08/18/2020 -We will continue eplerenone-dose increased at last visit -After his diagnosis of hyperaldosteronism and his adrenal vein sampling results returned, I referred him to [facility] for second opinion about management of his PA.  He was called about this but did not go for the appointment yet. -I did discuss with him that I would recommend that he consults with a specialist at [facility] to see if he needs adrenalectomy  2. HTN -Patient continues on a complex antihypertensive regimen including HCTZ, clonidine, doxazosin, hydralazine, and amlodipine, to which we added eplerenone in summer 2021.  He is currently on 100 mg twice a day.  At last visit, we stopped hydralazine in the morning, as he complained of fatigue after this dose.  We contained hydralazine at night. -At last visit, blood pressure was finally at goal, at 120/80, decreased from 142/90 at the previous visit.  A BMP did not show hyperkalemia. -In the past, he had muscle cramps and we started the magnesium supplement and also multivitamin.  He continues on these.  No side effects from the above regimen. -I advised him repeatedly at last visit to check his blood pressure at home and keep in touch with me about the values, but he did not do so yet.  I advised him to bring his blood pressure cuff to validated with our home.  He did not bring it today as he forgot, however, he mentions that at home, his systolic blood pressures are up to 130. -At today's visit, we will check a BMP -Since his blood pressure is controlled today, we will try stopping his clonidine at night.  I advised him that in 1 week afterwards, if blood pressures remain controlled in the morning, to also stop hydralazine at night. -I would like to see him back in 1.5 months  3.  B12 deficiency -We checked this due to fatigue and numbness in fingers -His B12 level was low so we started B12  injections-he gets these in PCPs office -He feels much better after he started the injections -He finished 6 months of injections - now off for ~ 1 mo -We will check a B12 level today-discussed that he may need to start B12 p.o.  Component     Latest Ref Rng & Units 03/26/2021  Sodium     135 - 145 mEq/L 135  Potassium     3.5 - 5.1 mEq/L 3.2 (L)  Chloride     96 - 112 mEq/L 100  CO2     19 - 32 mEq/L 24  Glucose     70 - 99 mg/dL 161 (H)  BUN     6 - 23 mg/dL 11  Creatinine     0.96 - 1.50 mg/dL 0.45  GFR     >40.98 mL/min 80.87  Calcium     8.4 - 10.5 mg/dL 9.6  Vitamin J19     147 - 911 pg/mL 348   B12 level is trending down.  I will advise him to  start 1000 mcg B12 daily.  We will recheck the level at next visit. Kidney function has not decreased significantly. His glucose level is slightly high.  This is not a fasting sample.  However, next visit, we will need to check an HbA1c level. No results found for: HGBA1C  Potassium has decreased.  I will advise him to start 20 mEq K and recheck.  Carlus Pavlov, MD PhD [facility] Endocrinology

## 2021-03-27 ENCOUNTER — Encounter: Payer: Self-pay | Admitting: Internal Medicine

## 2021-03-27 DIAGNOSIS — I152 Hypertension secondary to endocrine disorders: Secondary | ICD-10-CM

## 2021-03-27 DIAGNOSIS — E871 Hypo-osmolality and hyponatremia: Secondary | ICD-10-CM

## 2021-03-27 MED ORDER — POTASSIUM CHLORIDE ER 10 MEQ PO CPCR: 20.0000 meq | ORAL_CAPSULE | Freq: Every day | ORAL | 3 refills | Status: DC

## 2021-03-28 MED ORDER — POTASSIUM CHLORIDE ER 10 MEQ PO CPCR: 20.0000 meq | ORAL_CAPSULE | Freq: Every day | ORAL | 3 refills | Status: DC

## 2021-04-13 ENCOUNTER — Other Ambulatory Visit: Payer: Self-pay | Admitting: Internal Medicine

## 2021-05-22 ENCOUNTER — Other Ambulatory Visit: Payer: Self-pay | Admitting: Internal Medicine

## 2021-05-22 NOTE — Telephone Encounter (Signed)
Ok to approve 

## 2021-05-25 ENCOUNTER — Ambulatory Visit: Payer: BC Managed Care – PPO | Admitting: Internal Medicine

## 2021-06-11 ENCOUNTER — Other Ambulatory Visit: Payer: Self-pay | Admitting: Internal Medicine

## 2021-06-15 ENCOUNTER — Encounter: Payer: Self-pay | Admitting: Internal Medicine

## 2021-08-20 ENCOUNTER — Encounter: Payer: Self-pay | Admitting: Internal Medicine

## 2021-08-20 ENCOUNTER — Other Ambulatory Visit: Payer: Self-pay

## 2021-08-20 ENCOUNTER — Ambulatory Visit (INDEPENDENT_AMBULATORY_CARE_PROVIDER_SITE_OTHER): Payer: BC Managed Care – PPO | Admitting: Internal Medicine

## 2021-08-20 VITALS — BP 128/90 | HR 104 | Ht 74.0 in | Wt 316.6 lb

## 2021-08-20 DIAGNOSIS — E559 Vitamin D deficiency, unspecified: Secondary | ICD-10-CM

## 2021-08-20 DIAGNOSIS — E269 Hyperaldosteronism, unspecified: Secondary | ICD-10-CM | POA: Diagnosis not present

## 2021-08-20 DIAGNOSIS — I152 Hypertension secondary to endocrine disorders: Secondary | ICD-10-CM | POA: Diagnosis not present

## 2021-08-20 DIAGNOSIS — E538 Deficiency of other specified B group vitamins: Secondary | ICD-10-CM

## 2021-08-20 DIAGNOSIS — R739 Hyperglycemia, unspecified: Secondary | ICD-10-CM | POA: Diagnosis not present

## 2021-08-20 LAB — BASIC METABOLIC PANEL
BUN: 10 mg/dL (ref 6–23)
CO2: 26 mEq/L (ref 19–32)
Calcium: 9.6 mg/dL (ref 8.4–10.5)
Chloride: 100 mEq/L (ref 96–112)
Creatinine, Ser: 1.12 mg/dL (ref 0.40–1.50)
GFR: 78.92 mL/min (ref >60.00)
Glucose, Bld: 107 mg/dL — ABNORMAL HIGH (ref 70–99)
Potassium: 3.8 mEq/L (ref 3.5–5.1)
Sodium: 136 mEq/L (ref 135–145)

## 2021-08-20 LAB — VITAMIN B12: Vitamin B-12: 399 pg/mL (ref 211–911)

## 2021-08-20 LAB — HEMOGLOBIN A1C: Hgb A1c MFr Bld: 6.1 % (ref 4.6–6.5)

## 2021-08-20 MED ORDER — EPLERENONE 50 MG PO TABS: 150.0000 mg | ORAL_TABLET | Freq: Two times a day (BID) | ORAL | 3 refills | Status: DC

## 2021-08-20 NOTE — Progress Notes (Signed)
Patient ID: Andre Dean, male   DOB: 08/24/1976, 45 y.o.   MRN: [id number]   This visit occurred during the SARS-CoV-2 public health emergency.  Safety protocols were in place, including screening questions prior to the visit, additional usage of staff PPE, and extensive cleaning of exam room while observing appropriate contact time as indicated for disinfecting solutions.   HPI  Andre Dean is a 45 y.o.-year-old male, initially referred by his PCP, Dr. Corky Downs, returning for follow-up for primary hyperaldosteronism, uncontrolled hypertension, B12 deficiency.  Last visit 5 months ago.  Interim history: He is checking his blood sugars at home.  After stopping clonidine and hydralazine, he had increased eplerenone dose >> now1 systolic blood pressure is up to 130/80s. No chest pain, shortness of breath, palpitations, HAs, swelling.  Fatigue improved after stopping hydralazine.  Reviewed history: He was diagnosed with hypertension in his 18s.  Reviewed most recent blood pressure levels: BP Readings from Last 3 Encounters:  03/26/21 128/80  12/05/20 120/80  10/10/20 (!) 142/90  01/21/2020: 192/121  Reviewed and addended his medication regimen: At our first visit, he was on the following antihypertensive regimen: Lisinopril 40 mg daily Clonidine 0.2 mg 2x a day: ~4:30 to 5 PM when he arrives home from work, and at bedtime.  He cannot take this in the morning because of fatigue. Hydralazine 100 mg 3x a day HCTZ 25 mg daily Amlodipine 10 mg daily - added 12/2018  To be able to better investigate his hyperaldosteronism, we changed his regimen.  We changed to: Clonidine 0.2 mg 1-2x daily (he can only take it once a day at bedtime when he is working due to significant fatigue) HCTZ 25 mg daily Cardura 4 mg daily Hydralazine 100 mg 3 >> 2x a day Amlodipine 10 mg daily  On 07/25/2020: All of the above + Eplerenone 25 mg daily - was added by my colleague  On 08/18/2020: All of the above  + Eplerenone increased to 25 mg 2x a day  On 09/12/2020: All of the above + Eplerenone increased to 50 mg 2x a day  On 10/10/2020: All of the above + Eplerenone increased to 100 mg 2x a day Potassium stopped  On 03/26/2021: Clonidine 0.2 mg 1x daily (he can only take it once a day at bedtime when he is working due to significant fatigue) HCTZ 25 mg daily Cardura 4 mg daily Hydralazine 100 mg 1x a day Amlodipine 10 mg  Eplerenone 100 mg 2x a day Added potassium 20 mEq daily.  Was normal at that  Current regimen: HCTZ 25 mg daily Cardura 4 mg daily Amlodipine 10 mg  Eplerenone 100 mg 2x a day >> increase this by himself to 3x a day Potassium 20 mEq daily.  He did have side effects from the above regimen: -Hydralazine >> exacerbation of allergies and also fatigue if he was taking it midday >> was only taking hydralazine at night -Clonidine >> significant fatigue >> was only taking it at bedtime  He has a history of hypokalemia and was taking potassium 10 mEq daily until 10/10/2020 when we stopped it. However, we started back to 20 mEq daily 02/2021 after potassium level returned low.  He continues on this dose.  Reviewed potassium levels: Lab Results  Component Value Date   K 3.2 (L) 03/26/2021   K 3.7 01/24/2021   K 3.6 10/25/2020   K 3.6 10/10/2020   K 3.4 (L) 09/12/2020   K 2.9 (L) 07/13/2020   K 3.4 (L) 03/15/2020  K 3.4 (L) 09/04/2018   K 3.2 (L) 03/05/2018  02/22/2020: potassium 3.6 (3.5-5.3).  Received labs from 06/15/2020 from PCP: BMP normal with exception of a low potassium of 3.0 (3.5-5.3) Low magnesium at 1.4 (1.5-2.5) High CK at 730 (44-196)  He denies a history of hypotensive spells, headaches, palpitations, chest pain, blurry vision.  Investigation for pheochromocytoma was negative: 01/11/2019: Plasma metanephrines 38 (0-62), plasma normetanephrine 75 (0-175)  Investigation for Cushing syndrome was negative: 01/11/2019: 24-hour urine cortisol 15  (5-64)  However, further investigation pointing towards primary hyperaldosteronism: He had an increased aldosterone/PRA level: 02/22/2020: Aldosterone 5 (0-30), PRA 0.62, aldosterone/renin ratio 8.1 01/11/2019: Aldosterone 13.5 (0-30), PRA <0.167, aldosterone/renin ratio >81.4  5 months ago we rechecked his aldosterone and PRA and the ratio was still abnormal: Component     Latest Ref Rng & Units 03/15/2020  ALDOSTERONE     * ng/dL 11  Renin Activity     0.25 - 5.82 ng/mL/h 0.33  ALDO / PRA Ratio     0.9 - 28.9 Ratio 33.3 (H)  Potassium     3.5 - 5.1 mEq/L 3.4 (L)   After the above results returned, we proceeded with confirmatory test for hyperaldosteronism: P.o. saline suppression:   - At the end of 3 days of p.o. salt tablets, his 24-hour urine aldosterone was higher than 12 mcg per 24 hours in the setting of inappropriately elevated urine sodium, higher than 200 mmol per 24 hours.  Therefore, the test was positive for primary hyperaldosteronism:  Component     Latest Ref Rng & Units 04/17/2020  Sodium/Creat Ratio     30 - 180 mmol/g creat 108  Sodium, 24H Ur     52 - 380 mmol/24 h 333  Creatinine, 24H Ur     0.50 - 2.15 g/24 h 3.08 (H)  Volume, Urine-ALDU     mL 2,850  Aldosterone, 24H Ur     mcg/24 h 14.5  Creatinine, Urine mg/day-VMAUR     0.50 - 2.15 g/24 h 3.06 (H)   We then checked an adrenal CT scan (05/09/2020) and this was negative for adrenal masses: 1. No acute findings within the abdomen. No adrenal mass identified. 2. Small nonspecific pulmonary nodules are identified within the lung bases. No follow-up needed if patient is low-risk (and has no known or suspected primary neoplasm). Non-contrast chest CT can be considered in 12 months if patient is high-risk. This recommendation follows the consensus statement: Guidelines for Management of Incidental Pulmonary Nodules Detected on CT Images: From the Fleischner Society 2017; Radiology 2017; 284:228-243.  We next  checked him for glucocorticoid-remediable hyperaldosteronism (GRA).  For this, we checked a dexamethasone suppression test.  Cortisol level was low after dexamethasone: Cortisol after dexamethasone returned low, confirming steroid suppression: Component     Latest Ref Rng & Units 05/24/2020  Cortisol - AM     mcg/dL 0.9 (L)   However, his aldosterone was not suppressed after dexamethasone, ruling out GRA: Component     Latest Ref Rng & Units 05/24/2020  ALDOSTERONE     ng/dL 13   An adrenal venous sampling test was inconclusive, unfortunately, due to the cortisol assay (did not report actual values - see below).  Unfortunately, he has a co-pay of $6000 for this test.  I discussed over the phone with Dr. Fredia Sorrow about patient's AVS results.  It appears that the adrenal veins were correctly cannulated since the selectivity index was higher than 2 for both arteries.  His pre- ACTH  stimulation lateralization index is 12.5, which would indicate lateralization to the right adrenal vein.  However, the results obtained post- ACTH stimulation are inconclusive since cortisol was only reported as >100 and not with a distinct value, which makes interpretation of the aldosterone/cortisol ratios impossible.  Therefore, we will need to ignore the post-ACTH stimulation values for now, even though this would be a more accurate analysis compared to the pre-ACTH stimulation values.  If we only look at the pre-ACTH stimulation values, the aldosterone production from left adrenal vein is not suppressed, as would have been expected...   No history of hyper or hypothyroidism 01/11/2019: TSH 3.54 No results found for: TSH   No history of prediabetes or diabetes No results found for: HGBA1C   No history of hyperparathyroidism or hypercalcemia No results found for: PTH  Lab Results  Component Value Date   CALCIUM 9.6 03/26/2021   CALCIUM 10.0 01/24/2021   CALCIUM 9.7 10/25/2020   CALCIUM 9.2 10/10/2020   CALCIUM  9.5 09/12/2020   CALCIUM 9.0 07/13/2020   CALCIUM 9.8 09/04/2018   CALCIUM 8.5 (L) 03/05/2018   Kidney function is normal: Lab Results  Component Value Date   BUN 11 03/26/2021   BUN 11 01/24/2021   BUN 9 10/25/2020   Lab Results  Component Value Date   CREATININE 1.10 03/26/2021   CREATININE 1.07 01/24/2021   CREATININE 1.08 10/25/2020   Lab Results  Component Value Date   GFRAA >60 07/13/2020   GFRAA >60 09/04/2018   GFRAA >60 03/05/2018   He is not using any stimulants, street drugs, steroids, NSAID. He is using Xolair for asthma.  He is trying to cut back on smoking.  Of note, he had a normal pharmaceutical stress test on 08/22/2016.  He has a history of OSA.  He had turbinate surgery in 02/2020.  He has an extensive family history of uncontrolled hypertension in mother and maternal grandmother.  In 09/2020, he complained of fatigue and we checked a B12 level.  This returned low: Lab Results  Component Value Date   VITAMINB12 348 03/26/2021   VITAMINB12 184 (L) 10/10/2020   We started B12 inj's 1x a week x 4 weeks then monthly. He felt much better on this.  At last visit, he was off the injections for a month and I advised him to start 1000 mcg B12 daily.  He now continues on this.  He also has was found to have a low vitamin D >> prev. on Ergocalciferol 50,000 units weekly, now off.  We also started him on magnesium for mm cramps.  These have improved.Off Mg and MVI now.  ROS: + See HPI  I reviewed pt's medications, allergies, PMH, social hx, family hx, and changes were documented in the history of present illness. Otherwise, unchanged from my initial visit note.  Past Medical History:  Diagnosis Date   Asthma    Hypertension    Sinus congestion    Past Surgical History:  Procedure Laterality Date   IR RADIOLOGIST EVAL & MGMT  06/21/2020   IR US GUIDE VASC ACCESS RIGHT  07/13/2020   IR VENOCAVAGRAM IVC  07/13/2020   IR VENOGRAM ADRENAL BI  07/13/2020   IR  VENOGRAM RENAL UNI LEFT  07/13/2020   IR VENOUS SAMPLING  07/13/2020   IR VENOUS SAMPLING  07/13/2020   Social History   Socioeconomic History   Marital status: Single    Spouse name: Not on file   Number of children: 1: 57 y/o  in 01/2020   Years of education: Not on file   Highest education level: Not on file  Occupational History   Not on file  Tobacco Use   Smoking status: Current Every Day Smoker    Packs/day: 1/2    Types: Cigarettes   Smokeless tobacco: Never Used  Substance and Sexual Activity   Alcohol use: Yes    Comment: occasional, 2 drinks a week   Drug use: No   Sexual activity: Not on file  Other Topics Concern   Not on file  Social History Narrative   Not on file   Social Determinants of Health   Financial Resource Strain:    Difficulty of Paying Living Expenses: Not on file  Food Insecurity:    Worried About Running Out of Food in the Last Year: Not on file   Ran Out of Food in the Last Year: Not on file  Transportation Needs:    Lack of Transportation (Medical): Not on file   Lack of Transportation (Non-Medical): Not on file  Physical Activity:    Days of Exercise per Week: Not on file   Minutes of Exercise per Session: Not on file  Stress:    Feeling of Stress : Not on file  Social Connections:    Frequency of Communication with Friends and Family: Not on file   Frequency of Social Gatherings with Friends and Family: Not on file   Attends Religious Services: Not on file   Active Member of Clubs or Organizations: Not on file   Attends Banker Meetings: Not on file   Marital Status: Not on file  Intimate Partner Violence:    Fear of Current or Ex-Partner: Not on file   Emotionally Abused: Not on file   Physically Abused: Not on file   Sexually Abused: Not on file   Current Outpatient Medications on File Prior to Visit  Medication Sig Dispense Refill   acetaminophen (TYLENOL) 500 MG tablet Take 1,000 mg by mouth every 6 (six) hours  as needed for mild pain.     albuterol (VENTOLIN HFA) 108 (90 Base) MCG/ACT inhaler Inhale 1-2 puffs into the lungs every 4 (four) hours as needed for wheezing.      cloNIDine (CATAPRES) 0.2 MG tablet Take 0.2 mg by mouth daily.      doxazosin (CARDURA) 4 MG tablet TAKE 1 TABLET (4 MG TOTAL) BY MOUTH DAILY. 90 tablet 1   eplerenone (INSPRA) 50 MG tablet Take 2 tablets (100 mg total) by mouth 2 (two) times daily. 360 tablet 3   hydrALAZINE (APRESOLINE) 100 MG tablet Take 1 tablet (100 mg total) by mouth 2 (two) times daily. 270 tablet 3   hydrochlorothiazide (HYDRODIURIL) 25 MG tablet TAKE 1 TABLET BY MOUTH EVERY DAY 90 tablet 3   potassium chloride (MICRO-K) 10 MEQ CR capsule Take 2 capsules (20 mEq total) by mouth daily. 60 capsule 3   No current facility-administered medications on file prior to visit.   No Known Allergies No family history on file.   PE: BP 128/90 (BP Location: Right Arm, Patient Position: Sitting, Cuff Size: Normal)   Pulse (!) 104   Ht 6\' 2"  (1.88 m)   Wt (!) 316 lb 9.6 oz (143.6 kg)   SpO2 97%   BMI 40.65 kg/m  Wt Readings from Last 3 Encounters:  08/20/21 (!) 316 lb 9.6 oz (143.6 kg)  03/26/21 (!) 317 lb 6.4 oz (144 kg)  12/05/20 (!) 306 lb 12.8 oz (139.2 kg)  Constitutional: overweight, in NAD Eyes: PERRLA, EOMI, no exophthalmos ENT: moist mucous membranes, no thyromegaly, no cervical lymphadenopathy Cardiovascular: tachycardia, RR, No MRG Respiratory: CTA B Gastrointestinal: abdomen soft, NT, ND, BS+ Musculoskeletal: no deformities, strength intact in all 4 Skin: moist, warm, no rashes Neurological: + tremor with outstretched hands, DTR normal in all 4  ASSESSMENT: 1.  Hyperaldosteronism  2.  Endocrine hypertension  3.  B12 deficiency  4.  Vitamin D deficiency  5. Hyperglycemia  PLAN: 1.   Primary hyperaldosteronism -Patient with longstanding, uncontrolled, hypertension, on several different antihypertensive medications, diagnosed with  primary hyperaldosteronism last year.  For more details, please review my note from 08/18/2020 -We will continue eplerenone for blood pressure control.  I did explain that this blocks his aldosterone receptors. -After his diagnosis of hyperaldosteronism and his adrenal vein sampling results returned, [facility] PA.  He was called about this but did not go for the appointment. -For now, he would want to manage his hyperaldosteronism conservatively  2. HTN -Patient was on a complex antihypertensive regimen including HCTZ, clonidine, doxazosin, hydralazine and amlodipine, to which we added eplerenone summer 2021.  He is currently on 100 mg eplerenone twice a day.   -Before last visit, we were able to stop hydralazine in the morning as he had fatigue after this dose and at last visit I advised him to stop clonidine at night (he was only taking it once a day due to fatigue).  I advised him that if blood sugars remain controlled at home to also stop hydralazine at night.  He did stop both clonidine and hydralazine after our last visit but blood pressure started to rise so he increased his eplerenone (!) to 100 mg 3 times a day since last visit. -He is checking blood pressures at home but he did not bring his log at last visit.  I did advise him to bring his blood pressure machine to our appointments to validate it with our own.  He mentioned that his systolic blood pressures at home before last visit are only increasing up to 130.  At this visit, he remembered to bring his blood pressure machine and we checked blood pressure together in the office.  This was 125/93, very close to the blood pressure checked with the office cuff.  He mentions that he gets similar values at home, only his diastolic blood pressure is usually in the 80s. I advised him to continue to check his blood pressure at home daily. -At this visit, I advised him that eplerenone is not a  medication that needs to be taken 3 times a day, but I agree with him taking a higher dose twice a day: 150 mg BID.  We will definitely need to check his potassium today and I advised him that we may need to stop his potassium supplement. -At last visit, potassium was low, at 3.2 (3.5-5.1) so I advised him to start 20 mEq potassium daily.  We will check his BMP now.  -He had muscle cramps in the past and we started a multivitamin and also magnesium.  He stopped these, however, as he felt that they were not helping significantly.  At this visit, he continues to have muscle cramps and I advised him to stay well-hydrated, since we need to continue with his diuretic.  However, we will also check his potassium again and also vitamin D.   -I would like to see him back in  3-4 months  3.  B12 deficiency -We checked this due to fatigue and numbness in his fingers -His B12 level was low so we started B12 injections-which he gets in the PCPs office -He started to feel much better after he started the injections -At last visit, he was off the injections for 1 month.  We discussed about checking the B12 level and start B12 p.o. if at goal.  B12 level was 348 so I advised him to start 1000 mcg B12 daily.  He is currently taking this -We we will check his B12 level again today  4.  Vitamin D deficiency -Previously on ergocalciferol, now off for months -He continues to have muscle cramps -We will check a vitamin D level today  5. Hyperglycemia -He had a slightly elevated blood sugar on latest BMP, 119, and no HbA1c is available for review: No results found for: HGBA1C -No due to toxic symptoms: Blurry vision, increased urination, weight loss -We will check an HbA1c today  Component     Latest Ref Rng & Units 08/20/2021  Sodium     135 - 145 mEq/L 136  Potassium     3.5 - 5.1 mEq/L 3.8  Chloride     96 - 112 mEq/L 100  CO2     19 - 32 mEq/L 26  Glucose     70 - 99 mg/dL 161 (H)  BUN     6 - 23  mg/dL 10  Creatinine     0.96 - 1.50 mg/dL 0.45  GFR     >40.98 mL/min 78.92  Calcium     8.4 - 10.5 mg/dL 9.6  Vitamin J19     147 - 911 pg/mL 399  Hemoglobin A1C     4.6 - 6.5 % 6.1  Vitamin D, 25-Hydroxy     30.0 - 100.0 ng/mL 31.2  HbA1c is in the prediabetic range.  He does not have a formal diagnosis of prediabetes.  We will repeat his HbA1c at next visit.  In the meantime, I will advise him to improve diet and recommend weight loss. The rest of the labs are normal.  His potassium level is lower in the normal range so for now we will continue his potassium supplement.  I will recheck this at next visit. B12 level is better.  We will continue his oral supplement.  Vitamin D level is also in the normal range. Kidney function remains stable.  Carlus Pavlov, MD PhD [facility] Endocrinology

## 2021-08-20 NOTE — Patient Instructions (Addendum)
Please continue: HCTZ 25 mg daily Cardura 4 mg daily Amlodipine 10 mg  Potassium 20 mEq daily.  Change: Eplerenone 150 mg 2x a day  Please stop at the lab.  Please come back for a follow-up appointment in 3-4 months.

## 2021-08-21 ENCOUNTER — Encounter: Payer: Self-pay | Admitting: Internal Medicine

## 2021-08-21 DIAGNOSIS — J301 Allergic rhinitis due to pollen: Secondary | ICD-10-CM | POA: Diagnosis not present

## 2021-08-21 DIAGNOSIS — J0101 Acute recurrent maxillary sinusitis: Secondary | ICD-10-CM | POA: Diagnosis not present

## 2021-08-21 DIAGNOSIS — H1045 Other chronic allergic conjunctivitis: Secondary | ICD-10-CM | POA: Diagnosis not present

## 2021-08-21 DIAGNOSIS — J4540 Moderate persistent asthma, uncomplicated: Secondary | ICD-10-CM | POA: Diagnosis not present

## 2021-08-21 LAB — VITAMIN D 25 HYDROXY (VIT D DEFICIENCY, FRACTURES): Vit D, 25-Hydroxy: 31.2 ng/mL (ref 30.0–100.0)

## 2021-09-07 ENCOUNTER — Encounter: Payer: Self-pay | Admitting: Internal Medicine

## 2021-10-06 ENCOUNTER — Other Ambulatory Visit: Payer: Self-pay | Admitting: Internal Medicine

## 2021-12-20 ENCOUNTER — Ambulatory Visit: Payer: BC Managed Care – PPO | Admitting: Internal Medicine

## 2021-12-21 ENCOUNTER — Other Ambulatory Visit: Payer: Self-pay | Admitting: Internal Medicine

## 2022-02-13 DIAGNOSIS — M9903 Segmental and somatic dysfunction of lumbar region: Secondary | ICD-10-CM | POA: Diagnosis not present

## 2022-02-13 DIAGNOSIS — M9905 Segmental and somatic dysfunction of pelvic region: Secondary | ICD-10-CM | POA: Diagnosis not present

## 2022-02-13 DIAGNOSIS — M5116 Intervertebral disc disorders with radiculopathy, lumbar region: Secondary | ICD-10-CM | POA: Diagnosis not present

## 2022-02-13 DIAGNOSIS — M9904 Segmental and somatic dysfunction of sacral region: Secondary | ICD-10-CM | POA: Diagnosis not present

## 2022-03-07 ENCOUNTER — Ambulatory Visit: Payer: BC Managed Care – PPO | Admitting: Internal Medicine

## 2022-03-26 ENCOUNTER — Other Ambulatory Visit: Payer: Self-pay | Admitting: Internal Medicine

## 2022-05-31 ENCOUNTER — Other Ambulatory Visit: Payer: Self-pay | Admitting: Internal Medicine

## 2022-09-13 ENCOUNTER — Other Ambulatory Visit: Payer: Self-pay | Admitting: Internal Medicine

## 2022-10-08 ENCOUNTER — Other Ambulatory Visit: Payer: Self-pay | Admitting: Internal Medicine

## 2022-10-14 ENCOUNTER — Encounter: Payer: Self-pay | Admitting: Internal Medicine

## 2022-10-14 DIAGNOSIS — I152 Hypertension secondary to endocrine disorders: Secondary | ICD-10-CM

## 2022-10-15 MED ORDER — EPLERENONE 50 MG PO TABS: 150.0000 mg | ORAL_TABLET | Freq: Two times a day (BID) | ORAL | 3 refills | Status: DC

## 2022-10-15 MED ORDER — AMLODIPINE BESYLATE 10 MG PO TABS: 10.0000 mg | ORAL_TABLET | Freq: Every day | ORAL | 2 refills | Status: DC

## 2022-12-17 ENCOUNTER — Other Ambulatory Visit: Payer: Self-pay | Admitting: Internal Medicine

## 2022-12-30 ENCOUNTER — Other Ambulatory Visit: Payer: Self-pay | Admitting: Internal Medicine

## 2023-01-06 ENCOUNTER — Ambulatory Visit: Payer: BC Managed Care – PPO | Admitting: Internal Medicine

## 2023-05-20 ENCOUNTER — Other Ambulatory Visit: Payer: Self-pay | Admitting: Internal Medicine

## 2023-07-24 DIAGNOSIS — I10 Essential (primary) hypertension: Secondary | ICD-10-CM | POA: Diagnosis not present

## 2023-07-24 DIAGNOSIS — Z6839 Body mass index (BMI) 39.0-39.9, adult: Secondary | ICD-10-CM | POA: Diagnosis not present

## 2023-07-24 DIAGNOSIS — J4521 Mild intermittent asthma with (acute) exacerbation: Secondary | ICD-10-CM | POA: Diagnosis not present

## 2023-07-24 DIAGNOSIS — J0100 Acute maxillary sinusitis, unspecified: Secondary | ICD-10-CM | POA: Diagnosis not present

## 2023-07-31 DIAGNOSIS — I10 Essential (primary) hypertension: Secondary | ICD-10-CM | POA: Diagnosis not present

## 2023-08-24 ENCOUNTER — Other Ambulatory Visit: Payer: Self-pay | Admitting: Internal Medicine

## 2023-09-30 DIAGNOSIS — J4541 Moderate persistent asthma with (acute) exacerbation: Secondary | ICD-10-CM | POA: Diagnosis not present

## 2023-09-30 DIAGNOSIS — J3089 Other allergic rhinitis: Secondary | ICD-10-CM | POA: Diagnosis not present

## 2023-09-30 DIAGNOSIS — H1045 Other chronic allergic conjunctivitis: Secondary | ICD-10-CM | POA: Diagnosis not present

## 2023-09-30 DIAGNOSIS — J301 Allergic rhinitis due to pollen: Secondary | ICD-10-CM | POA: Diagnosis not present

## 2023-09-30 DIAGNOSIS — J4540 Moderate persistent asthma, uncomplicated: Secondary | ICD-10-CM | POA: Diagnosis not present

## 2023-10-06 ENCOUNTER — Other Ambulatory Visit: Payer: Self-pay | Admitting: Internal Medicine

## 2023-11-04 ENCOUNTER — Other Ambulatory Visit: Payer: Self-pay | Admitting: Internal Medicine

## 2023-11-04 DIAGNOSIS — I152 Hypertension secondary to endocrine disorders: Secondary | ICD-10-CM

## 2023-11-04 NOTE — Telephone Encounter (Signed)
Pt was last seen in clinic in more than 2 years.  He needs to get these prescriptions from PCP.  Will refill it now without refills.

## 2023-11-25 ENCOUNTER — Telehealth: Payer: Self-pay

## 2023-11-25 NOTE — Telephone Encounter (Signed)
Patient is scheduled for 03/22/2024 with Dr. Elvera Lennox.  He is in [place] until that time

## 2023-11-25 NOTE — Telephone Encounter (Signed)
Does he have a PCP in [place]?  He really needs to have blood pressure follow-up and this can be done by PCP.  If absolutely not possible, we could refill a 42-month supply with 1 refill.

## 2023-11-25 NOTE — Telephone Encounter (Signed)
Per Dr. Elvera Lennox this patient needs to be seen for more refills on Inspra.

## 2023-11-25 NOTE — Telephone Encounter (Signed)
FYI, pt is in [place] until his next appt.

## 2023-12-01 ENCOUNTER — Other Ambulatory Visit: Payer: Self-pay | Admitting: Internal Medicine

## 2023-12-01 ENCOUNTER — Encounter: Payer: Self-pay | Admitting: Internal Medicine

## 2023-12-02 ENCOUNTER — Other Ambulatory Visit: Payer: Self-pay | Admitting: Internal Medicine

## 2023-12-02 DIAGNOSIS — I10 Essential (primary) hypertension: Secondary | ICD-10-CM

## 2024-02-20 ENCOUNTER — Encounter: Payer: Self-pay | Admitting: Internal Medicine

## 2024-02-20 DIAGNOSIS — I152 Hypertension secondary to endocrine disorders: Secondary | ICD-10-CM

## 2024-02-23 MED ORDER — AMLODIPINE BESYLATE 10 MG PO TABS: 10.0000 mg | ORAL_TABLET | Freq: Every day | ORAL | 1 refills | Status: DC

## 2024-03-22 ENCOUNTER — Encounter: Payer: Self-pay | Admitting: Internal Medicine

## 2024-03-22 ENCOUNTER — Ambulatory Visit: Payer: BC Managed Care – PPO | Admitting: Internal Medicine

## 2024-03-22 VITALS — BP 124/80 | HR 93 | Ht 74.0 in | Wt 319.8 lb

## 2024-03-22 DIAGNOSIS — E269 Hyperaldosteronism, unspecified: Secondary | ICD-10-CM | POA: Diagnosis not present

## 2024-03-22 DIAGNOSIS — R7303 Prediabetes: Secondary | ICD-10-CM | POA: Diagnosis not present

## 2024-03-22 DIAGNOSIS — I10 Essential (primary) hypertension: Secondary | ICD-10-CM | POA: Diagnosis not present

## 2024-03-22 DIAGNOSIS — I152 Hypertension secondary to endocrine disorders: Secondary | ICD-10-CM

## 2024-03-22 MED ORDER — AMLODIPINE BESYLATE 10 MG PO TABS: 10.0000 mg | ORAL_TABLET | Freq: Every day | ORAL | 3 refills | Status: AC

## 2024-03-22 MED ORDER — EPLERENONE 50 MG PO TABS: 150.0000 mg | ORAL_TABLET | Freq: Two times a day (BID) | ORAL | 3 refills | Status: AC

## 2024-03-22 MED ORDER — HYDROCHLOROTHIAZIDE 25 MG PO TABS: 25.0000 mg | ORAL_TABLET | Freq: Every day | ORAL | 3 refills | Status: AC

## 2024-03-22 MED ORDER — DOXAZOSIN MESYLATE 4 MG PO TABS: 4.0000 mg | ORAL_TABLET | Freq: Every day | ORAL | 3 refills | Status: AC

## 2024-03-22 NOTE — Patient Instructions (Addendum)
 Please stop at the lab.  Please continue:  HCTZ 25 mg daily Cardura 4 mg daily Amlodipine 10 mg  Eplerenone 150 mg 2x a day   Start checking BPs at home.  Please return in 6 months.

## 2024-03-22 NOTE — Progress Notes (Addendum)
 Patient ID: Andre Dean, male   DOB: 03-07-1976, 48 y.o.   MRN: [id number]   HPI  Andre Dean is a 48 y.o.-year-old male, initially referred by his PCP, Dr. Corky Downs, returning for follow-up for primary hyperaldosteronism, uncontrolled hypertension, B12 deficiency.  Last visit 2 years and 7 months after last visit.   He is scheduled for the appointment as I could not refill his blood pressure medications without a visit.  He may does that he has been traveling for training since last visit and was very busy.  He is currently not traveling as much and works in [place].  Interim history: He was checking to check his blood pressure at home.  After stopping clonidine and hydralazine, we increase his eplerenone dose.  His fatigue improved after stopping hydralazine.  He feels well at today's visit, except for shortness of breath and cough due to allergies.  No fever. No chest pain, palpitations, HAs, leg swelling.  Does not have a PCP currently.  He is trying to establish care.  Reviewed history: He was diagnosed with hypertension in his 60s.  Reviewed most recent blood pressure levels: Last Filed Vital Signs Vital Sign Reading Time Taken Comments  Blood Pressure 130/90 07/24/2023 3:50 PM EDT At that time, he presented to urgent care for upper respiratory infection   BP Readings from Last 3 Encounters:  08/20/21 128/90  03/26/21 128/80  12/05/20 120/80  01/21/2020: 192/121  Reviewed and addended his medication regimen: At our first visit, he was on the following antihypertensive regimen: Lisinopril 40 mg daily Clonidine 0.2 mg 2x a day: ~4:30 to 5 PM when he arrives home from work, and at bedtime.  He cannot take this in the morning because of fatigue. Hydralazine 100 mg 3x a day HCTZ 25 mg daily Amlodipine 10 mg daily - added 12/2018  To be able to better investigate his hyperaldosteronism, we changed his regimen.  We changed to: Clonidine 0.2 mg 1-2x daily (he can only take it  once a day at bedtime when he is working due to significant fatigue) HCTZ 25 mg daily Cardura 4 mg daily Hydralazine 100 mg 3 >> 2x a day Amlodipine 10 mg daily  On 07/25/2020: All of the above + Eplerenone 25 mg daily - was added by my colleague  On 08/18/2020: All of the above + Eplerenone increased to 25 mg 2x a day  On 09/12/2020: All of the above + Eplerenone increased to 50 mg 2x a day  On 10/10/2020: All of the above + Eplerenone increased to 100 mg 2x a day Potassium stopped  On 03/26/2021: Clonidine 0.2 mg 1x daily (he can only take it once a day at bedtime when he is working due to significant fatigue) HCTZ 25 mg daily Cardura 4 mg daily Hydralazine 100 mg 1x a day Amlodipine 10 mg  Eplerenone 100 mg 2x a day Added potassium 20 mEq daily.  Was normal at that  Current regimen: HCTZ 25 mg daily Cardura 4 mg daily Amlodipine 10 mg  Eplerenone 100 mg 2x a day >> increase this by himself to 3x a day Potassium 20 mEq daily.  He did have side effects from the above regimen: -Hydralazine >> exacerbation of allergies and also fatigue if he was taking it midday >> was only taking hydralazine at night -Clonidine >> significant fatigue >> was only taking it at bedtime  He has a history of hypokalemia and was taking potassium 10 mEq daily until 10/10/2020 when we stopped it. However, we  started back to 20 mEq daily 02/2021 after potassium level returned low.    At today's visit he is on the following regimen: HCTZ 25 mg daily Cardura 4 mg daily Amlodipine 10 mg  Eplerenone 150 mg 2x a day  Also, potassium 20 mEq daily - stopped few mo ago.  Reviewed potassium levels: 08/02/2023: Potassium 4.3 Lab Results  Component Value Date   K 3.8 08/20/2021   K 3.2 (L) 03/26/2021   K 3.7 01/24/2021   K 3.6 10/25/2020   K 3.6 10/10/2020   K 3.4 (L) 09/12/2020   K 2.9 (L) 07/13/2020   K 3.4 (L) 03/15/2020   K 3.4 (L) 09/04/2018   K 3.2 (L) 03/05/2018  02/22/2020: potassium  3.6 (3.5-5.3).  Received labs from 06/15/2020 from PCP: BMP normal with exception of a low potassium of 3.0 (3.5-5.3) Low magnesium at 1.4 (1.5-2.5) High CK at 730 (44-196)  He denies a history of hypotensive spells, headaches, palpitations, chest pain, blurry vision.  Investigation for pheochromocytoma was negative: 01/11/2019: Plasma metanephrines 38 (0-62), plasma normetanephrine 75 (0-175)  Investigation for Cushing syndrome was negative: 01/11/2019: 24-hour urine cortisol 15 (5-64)  However, further investigation pointing towards primary hyperaldosteronism: He had an increased aldosterone/PRA level: 02/22/2020: Aldosterone 5 (0-30), PRA 0.62, aldosterone/renin ratio 8.1 01/11/2019: Aldosterone 13.5 (0-30), PRA <0.167, aldosterone/renin ratio >81.4  5 months ago we rechecked his aldosterone and PRA and the ratio was still abnormal: Component     Latest Ref Rng & Units 03/15/2020  ALDOSTERONE     * ng/dL 11  Renin Activity     0.25 - 5.82 ng/mL/h 0.33  ALDO / PRA Ratio     0.9 - 28.9 Ratio 33.3 (H)  Potassium     3.5 - 5.1 mEq/L 3.4 (L)   After the above results returned, we proceeded with confirmatory test for hyperaldosteronism: P.o. saline suppression:   - At the end of 3 days of p.o. salt tablets, his 24-hour urine aldosterone was higher than 12 mcg per 24 hours in the setting of inappropriately elevated urine sodium, higher than 200 mmol per 24 hours.  Therefore, the test was positive for primary hyperaldosteronism:  Component     Latest Ref Rng & Units 04/17/2020  Sodium/Creat Ratio     30 - 180 mmol/g creat 108  Sodium, 24H Ur     52 - 380 mmol/24 h 333  Creatinine, 24H Ur     0.50 - 2.15 g/24 h 3.08 (H)  Volume, Urine-ALDU     mL 2,850  Aldosterone, 24H Ur     mcg/24 h 14.5  Creatinine, Urine mg/day-VMAUR     0.50 - 2.15 g/24 h 3.06 (H)   We then checked an adrenal CT scan (05/09/2020) and this was negative for adrenal masses: 1. No acute findings within the  abdomen. No adrenal mass identified. 2. Small nonspecific pulmonary nodules are identified within the lung bases. No follow-up needed if patient is low-risk (and has no known or suspected primary neoplasm). Non-contrast chest CT can be considered in 12 months if patient is high-risk. This recommendation follows the consensus statement: Guidelines for Management of Incidental Pulmonary Nodules Detected on CT Images: From the Fleischner Society 2017; Radiology 2017; 284:228-243.  We next checked him for glucocorticoid-remediable hyperaldosteronism (GRA).  For this, we checked a dexamethasone suppression test.  Cortisol level was low after dexamethasone: Cortisol after dexamethasone returned low, confirming steroid suppression: Component     Latest Ref Rng & Units 05/24/2020  Cortisol -  AM     mcg/dL 0.9 (L)   However, his aldosterone was not suppressed after dexamethasone, ruling out GRA: Component     Latest Ref Rng & Units 05/24/2020  ALDOSTERONE     ng/dL 13   An adrenal venous sampling test was inconclusive, unfortunately, due to the cortisol assay (did not report actual values - see below).  Unfortunately, he has a co-pay of $6000 for this test.  I discussed over the phone with Dr. Fredia Sorrow about patient's AVS results.  It appears that the adrenal veins were correctly cannulated since the selectivity index was higher than 2 for both arteries.  His pre- ACTH stimulation lateralization index is 12.5, which would indicate lateralization to the right adrenal vein.  However, the results obtained post- ACTH stimulation are inconclusive since cortisol was only reported as >100 and not with a distinct value, which makes interpretation of the aldosterone/cortisol ratios impossible.  Therefore, we will need to ignore the post-ACTH stimulation values for now, even though this would be a more accurate analysis compared to the pre-ACTH stimulation values.  If we only look at the pre-ACTH stimulation  values, the aldosterone production from left adrenal vein is not suppressed, as would have been expected...   No history of hyper or hypothyroidism 01/11/2019: TSH 3.54 No results found for: "TSH"   No history of prediabetes or diabetes 07/31/2023: HbA1c 6.1% Lab Results  Component Value Date   HGBA1C 6.1 08/20/2021    No history of hyperparathyroidism or hypercalcemia:  No results found for: "PTH"  07/31/2023: Calcium 9.6, albumin 4.3 Lab Results  Component Value Date   CALCIUM 9.6 08/20/2021   CALCIUM 9.6 03/26/2021   CALCIUM 10.0 01/24/2021   CALCIUM 9.7 10/25/2020   CALCIUM 9.2 10/10/2020   CALCIUM 9.5 09/12/2020   CALCIUM 9.0 07/13/2020   CALCIUM 9.8 09/04/2018   CALCIUM 8.5 (L) 03/05/2018   Kidney function is normal:  Lab Results  Component Value Date   BUN 10 08/20/2021   BUN 11 03/26/2021   BUN 11 01/24/2021   Lab Results  Component Value Date   CREATININE 1.12 08/20/2021   CREATININE 1.10 03/26/2021   CREATININE 1.07 01/24/2021   Lab Results  Component Value Date   GFRAA >60 07/13/2020   GFRAA >60 09/04/2018   GFRAA >60 03/05/2018   He is not using any stimulants, street drugs, steroids, NSAIDs.   Of note, he had a normal pharmaceutical stress test on 08/22/2016.  He has an extensive family history of uncontrolled hypertension in mother and maternal grandmother.  Latest lipid panel reviewed:   Patient has a history of B12 and low normal vitamin D: Lab Results  Component Value Date   VITAMINB12 399 08/20/2021   VITAMINB12 348 03/26/2021   VITAMINB12 184 (L) 10/10/2020   Lab Results  Component Value Date   VD25OH 31.2 08/20/2021   We started B12 inj's 1x a week x 4 weeks then monthly. He felt much better on this.  At last visit, he was off the injections for a month and I advised him to start 1000 mcg B12 daily.  He now continues on this. He was prev. on Ergocalciferol 50,000 units weekly, then came off before last visit. We also started him on  magnesium for mm cramps.  These have improved. Off Mg and MVI.  He has a history of OSA.  He had turbinate surgery in 02/2020.  He is using Xolair for asthma.  He is trying to cut back on smoking.  ROS: + See HPI  I reviewed pt's medications, allergies, PMH, social hx, family hx, and changes were documented in the history of present illness. Otherwise, unchanged from my initial visit note.  Past Medical History:  Diagnosis Date   Asthma    Hypertension    Sinus congestion    Past Surgical History:  Procedure Laterality Date   IR RADIOLOGIST EVAL & MGMT  06/21/2020   IR US GUIDE VASC ACCESS RIGHT  07/13/2020   IR VENOCAVAGRAM IVC  07/13/2020   IR VENOGRAM ADRENAL BI  07/13/2020   IR VENOGRAM RENAL UNI LEFT  07/13/2020   IR VENOUS SAMPLING  07/13/2020   IR VENOUS SAMPLING  07/13/2020   Social History   Socioeconomic History   Marital status: Single    Spouse name: Not on file   Number of children: 1: 63 y/o in 01/2020   Years of education: Not on file   Highest education level: Not on file  Occupational History   Not on file  Tobacco Use   Smoking status: Current Every Day Smoker    Packs/day: 1/2    Types: Cigarettes   Smokeless tobacco: Never Used  Substance and Sexual Activity   Alcohol use: Yes    Comment: occasional, 2 drinks a week   Drug use: No   Sexual activity: Not on file  Other Topics Concern   Not on file  Social History Narrative   Not on file   Social Determinants of Health   Financial Resource Strain:    Difficulty of Paying Living Expenses: Not on file  Food Insecurity:    Worried About Radiation protection practitioner of Food in the Last Year: Not on file   [employer] of Food in the Last Year: Not on file  Transportation Needs:    Lack of Transportation (Medical): Not on file   Lack of Transportation (Non-Medical): Not on file  Physical Activity:    Days of Exercise per Week: Not on file   Minutes of Exercise per Session: Not on file  Stress:    Feeling of Stress :  Not on file  Social Connections:    Frequency of Communication with Friends and Family: Not on file   Frequency of Social Gatherings with Friends and Family: Not on file   Attends Religious Services: Not on file   Active Member of Clubs or Organizations: Not on file   Attends Banker Meetings: Not on file   Marital Status: Not on file  Intimate Partner Violence:    Fear of Current or Ex-Partner: Not on file   Emotionally Abused: Not on file   Physically Abused: Not on file   Sexually Abused: Not on file   Current Outpatient Medications on File Prior to Visit  Medication Sig Dispense Refill   acetaminophen (TYLENOL) 500 MG tablet Take 1,000 mg by mouth every 6 (six) hours as needed for mild pain.     albuterol (VENTOLIN HFA) 108 (90 Base) MCG/ACT inhaler Inhale 1-2 puffs into the lungs every 4 (four) hours as needed for wheezing.      amLODipine (NORVASC) 10 MG tablet Take 1 tablet (10 mg total) by mouth daily. 30 tablet 1   doxazosin (CARDURA) 4 MG tablet TAKE 1 TABLET (4 MG TOTAL) BY MOUTH NIGHTLY FOR 90 DAYS 90 tablet 0   eplerenone (INSPRA) 50 MG tablet TAKE 3 TABLETS BY MOUTH TWICE A DAY 540 tablet 0   hydrochlorothiazide (HYDRODIURIL) 25 MG tablet TAKE 1 TABLET BY MOUTH EVERY  DAY 90 tablet 3   potassium chloride (MICRO-K) 10 MEQ CR capsule Take 2 capsules (20 mEq total) by mouth daily. 60 capsule 3   No current facility-administered medications on file prior to visit.   No Known Allergies No family history on file.   PE: BP 124/80   Pulse 93   Ht 6\' 2"  (1.88 m)   Wt (!) 319 lb 12.8 oz (145.1 kg)   SpO2 97%   BMI 41.06 kg/m  Wt Readings from Last 3 Encounters:  03/22/24 (!) 319 lb 12.8 oz (145.1 kg)  08/20/21 (!) 316 lb 9.6 oz (143.6 kg)  03/26/21 (!) 317 lb 6.4 oz (144 kg)   Constitutional: overweight, in NAD Eyes:  EOMI, no exophthalmos ENT: no neck masses, no cervical lymphadenopathy Cardiovascular: RRR, No MRG Respiratory: CTA B Musculoskeletal: no  deformities Skin:no rashes Neurological: + Rtremor with outstretched hands L>R  ASSESSMENT: 1.  Hyperaldosteronism  2.  Endocrine hypertension  3.  Prediabetes  4. B12 deficiency -He was started on B12 injections through PCPs office, then switch to 1000 mcg B12 p.o. daily -Further management per PCP  5.  Vitamin D deficiency -Previously on ergocalciferol, then came off -Further management per PCP  PLAN: 1.   Primary hyperaldosteronism -Patient with longstanding, uncontrolled, hypertension, on several different antihypertensive medications, diagnosed with primary hyperaldosteronism - For more details, please review my note from 08/18/2020. -We will continue eplerenone for blood pressure control.  I did explain that this blocks his aldosterone receptors. -After his diagnosis of hyperaldosteronism and his adrenal vein sampling results returned, I referred him to [facility] for second opinion about management of his PA.  He was called about this but did not go for the appointment. -For now, we will manage his hyperaldosteronism conservatively, per his preference  2. HTN -Blood pressure is excellent today -Patient with a history of a complex antihypertensive regimen including HCTZ, clonidine, doxazosin, hydralazine, and amlodipine, to which we added eplerenone in summer 2021.   -We were able to stop hydralazine which helped his fatigue and we also tried to stop clonidine at night (he was using it only once a day due to fatigue).  I advised him that if the blood pressure remained controlled at home and also stopped the hydralazine at night.  He was able to do so.  When I saw him last time, he was taking eplerenone to 100 mg 3 times a day, increased by himself from 2 times a day. -At last visit, his reporting systolic blood pressures were only up to 130 and the diastolic blood pressure was up to the 80s.  At that time, we validated his machine readings with ours.  I advised him to continue to check  his blood sugars at home daily -We discussed at last visit that eplerenone was not a medication that needed to be taken 3 times daily but we switched to 150 mg twice daily.  At last visit, potassium was normal on a supplement -  We previously started him on 20 mEq potassium daily after potassium returned low, 3.2 (3.5-5.1).  At last visit, potassium level was normal so we continued the supplement.  However, at today's visit he is off the supplement after running out.  We will definitely need to check his potassium today and I advised him that we may need to restart his potassium supplement.   -At today's visit, we will recheck his BMP and also his renin activity.  We discussed that our target is a detectable, nonsuppressed  renin. -He had muscle cramps in the past and we started a multivitamin and also magnesium.  He stopped these, however, as he felt that they were not helping significantly.  At this visit, he continues to have muscle cramps and I advised him to stay well-hydrated, since we needed to continue his diuretic.  He is not feeling well, without complaints. -I advised him to restart checking blood pressure at home.  I gave him a handout about how to do this correctly. -He is also preparing to restart exercise.  I strongly advised him to do so, now that he is not traveling as much. -I we will see him back in 6 months  3.  Prediabetes -He had a slightly elevated blood sugar, at 119 previously so we checked his HbA1c at last visit.  This was in the prediabetic range:  Lab Results  Component Value Date   HGBA1C 6.1 08/20/2021  -He had another HbA1c obtained on 07/31/2023: Which was stable, at 6.1%, giving him a diagnosis of prediabetes -We discussed about the reversible nature of this condition, but he does need to optimize his weight, to improve diet and exercise consistently.  He is planning to restart exercise-he has the equipment at home. -He does not have glucotoxicity signs or symptoms: No  blurry vision, unintentional weight loss, increased urination or thirst -We will recheck his HbA1c today  Orders Placed This Encounter  Procedures   BASIC METABOLIC PANEL WITH GFR   Hemoglobin A1c   Aldosterone + renin activity w/ ratio   Component     Latest Ref Rng 03/22/2024  Glucose     65 - 99 mg/dL 045 (H)   BUN     7 - 25 mg/dL 8   Creatinine     4.09 - 1.29 mg/dL 8.11   eGFR     > OR = 60 mL/min/1.25m2 98   BUN/Creatinine Ratio     6 - 22 (calc) SEE NOTE:   Sodium     135 - 146 mmol/L 138   Potassium     3.5 - 5.3 mmol/L 3.4 (L)   Chloride     98 - 110 mmol/L 98   CO2     20 - 32 mmol/L 28   Calcium     8.6 - 10.3 mg/dL 9.8    Component     Latest Ref Rng 03/22/2024  ALDOSTERONE      ng/dL 21   Renin Activity     0.25 - 5.82 ng/mL/h 0.38   ALDO / PRA Ratio     0.9 - 28.9 Ratio 55.3 (H)   Potassium slightly low so we started back his potassium supplementation. His renin activity is detectable, which is our target.  For now, we can continue the same dose of eplerenone.  I plan to recheck his renin at next visit.  Carlus Pavlov, MD PhD [facility] Endocrinology

## 2024-03-24 ENCOUNTER — Encounter: Payer: Self-pay | Admitting: Internal Medicine

## 2024-03-24 DIAGNOSIS — I152 Hypertension secondary to endocrine disorders: Secondary | ICD-10-CM

## 2024-03-25 ENCOUNTER — Encounter: Payer: Self-pay | Admitting: Internal Medicine

## 2024-03-25 MED ORDER — POTASSIUM CHLORIDE ER 10 MEQ PO CPCR: 20.0000 meq | ORAL_CAPSULE | Freq: Every day | ORAL | 1 refills | Status: AC

## 2024-03-29 LAB — BASIC METABOLIC PANEL WITH GFR
BUN: 8 mg/dL (ref 7–25)
CO2: 28 mmol/L (ref 20–32)
Calcium: 9.8 mg/dL (ref 8.6–10.3)
Chloride: 98 mmol/L (ref 98–110)
Creat: 0.96 mg/dL (ref 0.60–1.29)
Glucose, Bld: 106 mg/dL — ABNORMAL HIGH (ref 65–99)
Potassium: 3.4 mmol/L — ABNORMAL LOW (ref 3.5–5.3)
Sodium: 138 mmol/L (ref 135–146)
eGFR: 98 mL/min/{1.73_m2} (ref >=60)

## 2024-03-29 LAB — HEMOGLOBIN A1C
Hgb A1c MFr Bld: 5.8 %{Hb} — ABNORMAL HIGH (ref <5.7)
Mean Plasma Glucose: 120 mg/dL
eAG (mmol/L): 6.6 mmol/L

## 2024-03-29 LAB — ALDOSTERONE + RENIN ACTIVITY W/ RATIO
ALDO / PRA Ratio: 55.3 ratio — ABNORMAL HIGH (ref 0.9–28.9)
Aldosterone: 21 ng/dL
Renin Activity: 0.38 ng/mL/h (ref 0.25–5.82)

## 2024-03-30 ENCOUNTER — Encounter: Payer: Self-pay | Admitting: Internal Medicine

## 2024-09-23 ENCOUNTER — Ambulatory Visit: Admitting: Internal Medicine

## 2024-12-17 ENCOUNTER — Ambulatory Visit: Admitting: Internal Medicine

## 2024-12-17 NOTE — Progress Notes (Deleted)
 Patient ID: Andre Dean, male   DOB: [birth year], 48 y.o.   MRN: [id number]   HPI  Andre Dean is a 48 y.o.-year-old male, initially referred by his PCP, Dr. Roanna, returning for follow-up for primary hyperaldosteronism, uncontrolled hypertension, B12 deficiency.  Last visit 9 months ago.  Previous visit 2 years and 7 months prior.  Interim history: He was checking to check his blood pressure at home.  After stopping clonidine and hydralazine , we increase his eplerenone  dose.  His fatigue improved after stopping hydralazine  and clonidine.   He feels well at today's visit, except for shortness of breath and cough due to allergies. No chest pain, palpitations, HAs, leg swelling.   Reviewed history: He was diagnosed with hypertension in his 34s.  Reviewed most recent blood pressure levels: BP Readings from Last 3 Encounters:  03/22/24 124/80  08/20/21 128/90  03/26/21 128/80  01/21/2020: 192/121  Reviewed and addended his medication regimen: At our first visit, he was on the following antihypertensive regimen: Lisinopril 40 mg daily Clonidine 0.2 mg 2x a day: ~4:30 to 5 PM when he arrives home from work, and at bedtime.  He cannot take this in the morning because of fatigue. Hydralazine  100 mg 3x a day HCTZ 25 mg daily Amlodipine  10 mg daily - added 12/2018  To be able to better investigate his hyperaldosteronism, we changed his regimen to: Clonidine 0.2 mg 1-2x daily (he could only take it once a day at bedtime when he was working due to significant fatigue) HCTZ 25 mg daily Cardura  4 mg daily Hydralazine  100 mg 3 >> 2x a day Amlodipine  10 mg daily  On 07/25/2020: All of the above + Eplerenone  25 mg daily - was added by my colleague  On 08/18/2020: All of the above + Eplerenone  increased to 25 mg 2x a day  On 09/12/2020: All of the above + Eplerenone  increased to 50 mg 2x a day  On 10/10/2020: All of the above + Eplerenone  increased to 100 mg 2x a day Potassium  stopped  On 03/26/2021: Clonidine 0.2 mg 1x daily (he can only take it once a day at bedtime when he is working due to significant fatigue) HCTZ 25 mg daily Cardura  4 mg daily Hydralazine  100 mg 1x a day Amlodipine  10 mg  Eplerenone  100 mg 2x a day Added potassium 20 mEq daily.    Afterwards: HCTZ 25 mg daily Cardura  4 mg daily Amlodipine  10 mg  Eplerenone  100 mg 2x a day >> increase this by himself to 3x a day Potassium 20 mEq daily.  He did have side effects from the above regimen: -Hydralazine  >> exacerbation of allergies and also fatigue if he was taking it midday >> was only taking hydralazine  at night >> stopped  -Clonidine >> significant fatigue >> was only taking it at bedtime >> stopped  He has been off and on potassium supplements in the past.  At our visit from 02/2024, he was on: HCTZ 25 mg daily Cardura  4 mg daily Amlodipine  10 mg  Eplerenone  150 mg 2x a day  At that time, he was off potassium 20 mEq daily for a few months.  We restarted this.  At today's visit, he is on: HCTZ 25 mg daily Cardura  4 mg daily Amlodipine  10 mg  Eplerenone  150 mg 2x a day  Potassium 20 mill equivalents daily  Reviewed potassium levels: Lab Results  Component Value Date   K 3.4 (L) 03/22/2024   K 3.8 08/20/2021   K 3.2 (L)  03/26/2021   K 3.7 01/24/2021   K 3.6 10/25/2020   K 3.6 10/10/2020   K 3.4 (L) 09/12/2020   K 2.9 (L) 07/13/2020   K 3.4 (L) 03/15/2020   K 3.4 (L) 09/04/2018   K 3.2 (L) 03/05/2018  08/02/2023: Potassium 4.3 02/22/2020: potassium 3.6 (3.5-5.3).  Received labs from 06/15/2020 from PCP: BMP normal with exception of a low potassium of 3.0 (3.5-5.3) Low magnesium at 1.4 (1.5-2.5) High CK at 730 (44-196)  He denies a history of hypotensive spells, headaches, palpitations, chest pain, blurry vision.  Investigation for pheochromocytoma was negative: 01/11/2019: Plasma metanephrines 38 (0-62), plasma normetanephrine 75 (0-175)  Investigation for Cushing  syndrome was negative: 01/11/2019: 24-hour urine cortisol 15 (5-64)  However, further investigation pointing towards primary hyperaldosteronism: He had an increased aldosterone/PRA level: 02/22/2020: Aldosterone 5 (0-30), PRA 0.62, aldosterone/renin ratio 8.1 01/11/2019: Aldosterone 13.5 (0-30), PRA <0.167, aldosterone/renin ratio >81.4  We rechecked his aldosterone and PRA and the ratio was still abnormal: Component     Latest Ref Rng & Units 03/15/2020  ALDOSTERONE     * ng/dL 11  Renin Activity     0.25 - 5.82 ng/mL/h 0.33  ALDO / PRA Ratio     0.9 - 28.9 Ratio 33.3 (H)  Potassium     3.5 - 5.1 mEq/L 3.4 (L)   After the above results returned, we proceeded with confirmatory test for hyperaldosteronism: P.o. saline suppression:   - At the end of 3 days of p.o. salt tablets, his 24-hour urine aldosterone was higher than 12 mcg per 24 hours in the setting of inappropriately elevated urine sodium, higher than 200 mmol per 24 hours.  Therefore, the test was positive for primary hyperaldosteronism:  Component     Latest Ref Rng & Units 04/17/2020  Sodium/Creat Ratio     30 - 180 mmol/g creat 108  Sodium, 24H Ur     52 - 380 mmol/24 h 333  Creatinine, 24H Ur     0.50 - 2.15 g/24 h 3.08 (H)  Volume, Urine-ALDU     mL 2,850  Aldosterone, 24H Ur     mcg/24 h 14.5  Creatinine, Urine mg/day-VMAUR     0.50 - 2.15 g/24 h 3.06 (H)   We then checked an adrenal CT scan (05/09/2020) and this was negative for adrenal masses: 1. No acute findings within the abdomen. No adrenal mass identified. 2. Small nonspecific pulmonary nodules are identified within the lung bases. No follow-up needed if patient is low-risk (and has no known or suspected primary neoplasm). Non-contrast chest CT can be considered in 12 months if patient is high-risk. This recommendation follows the consensus statement: Guidelines for Management of Incidental Pulmonary Nodules Detected on CT Images: From the Fleischner  Society 2017; Radiology 2017; 284:228-243.  We next checked him for glucocorticoid-remediable hyperaldosteronism (GRA) by a dexamethasone  suppression test.  Cortisol level was low after dexamethasone , confirming steroid suppression: Component     Latest Ref Rng & Units 05/24/2020  Cortisol - AM     mcg/dL 0.9 (L)   However, his aldosterone was not suppressed after dexamethasone , ruling out GRA: Component     Latest Ref Rng & Units 05/24/2020  ALDOSTERONE     ng/dL 13   An adrenal venous sampling test was inconclusive, unfortunately, due to the cortisol assay results (did not report actual values - see below).  Unfortunately, he has a co-pay of $6000 for this test.  I discussed over the phone with Dr. Luverne about  patient's AVS results.  It appears that the adrenal veins were correctly cannulated since the selectivity index was higher than 2 for both arteries.  His pre- ACTH stimulation lateralization index is 12.5, which would indicate lateralization to the right adrenal vein.  However, the results obtained post- ACTH stimulation are inconclusive since cortisol was only reported as >100 and not with a distinct value, which made interpretation of the aldosterone/cortisol ratios impossible.  Therefore, we had to ignore the post-ACTH stimulation values, even though this would have been a more accurate analysis compared to the pre-ACTH stimulation values.  Only looking at the pre-ACTH stimulation values, the aldosterone production from left adrenal vein was not suppressed, as would have been expected...   Last visit from 02/2024: PRA was detectable and aldosterone level was normal.  Potassium was low so we added a potassium supplement back: Component     Latest Ref Rng 03/22/2024  Potassium     3.5 - 5.3 mmol/L 3.4 (L)    Component     Latest Ref Rng 03/22/2024  ALDOSTERONE      ng/dL 21   Renin Activity     0.25 - 5.82 ng/mL/h 0.38   ALDO / PRA Ratio     0.9 - 28.9 Ratio 55.3 (H)    He  does have a history of prediabetes: Lab Results  Component Value Date   HGBA1C 5.8 (H) 03/22/2024   HGBA1C 6.1 08/20/2021  07/31/2023: HbA1c 6.1%  No history of hyperparathyroidism or hypercalcemia:  No results found for: PTH   Lab Results  Component Value Date   CALCIUM 9.8 03/22/2024   CALCIUM 9.6 08/20/2021   CALCIUM 9.6 03/26/2021   CALCIUM 10.0 01/24/2021   CALCIUM 9.7 10/25/2020   CALCIUM 9.2 10/10/2020   CALCIUM 9.5 09/12/2020   CALCIUM 9.0 07/13/2020   CALCIUM 9.8 09/04/2018   CALCIUM 8.5 (L) 03/05/2018  07/31/2023: Calcium 9.6, albumin 4.3  Kidney function is normal: Lab Results  Component Value Date   BUN 8 03/22/2024   BUN 10 08/20/2021   BUN 11 03/26/2021   Lab Results  Component Value Date   CREATININE 0.96 03/22/2024   CREATININE 1.12 08/20/2021   CREATININE 1.10 03/26/2021   Lab Results  Component Value Date   GFRAA >60 07/13/2020   GFRAA >60 09/04/2018   GFRAA >60 03/05/2018   He is not using any stimulants, street drugs, steroids, NSAIDs.   Of note, he had a normal pharmaceutical stress test on 08/22/2016.  He has an extensive family history of uncontrolled hypertension in mother and maternal grandmother.  Latest lipid panel reviewed:    He continues to have muscle cramps.  Now on potassium.  Magnesium and multivitamins did not help much.  He has a history of OSA.  He had turbinate surgery in 02/2020.  He is using Xolair for asthma.  He is trying to cut back on smoking.  No history of hyper or hypothyroidism 01/11/2019: TSH 3.54 No results found for: TSH 6  ROS: + See HPI  I reviewed pt's medications, allergies, PMH, social hx, family hx, and changes were documented in the history of present illness. Otherwise, unchanged from my initial visit note.  Past Medical History:  Diagnosis Date   Asthma    Hypertension    Sinus congestion    Past Surgical History:  Procedure Laterality Date   IR RADIOLOGIST EVAL & MGMT  06/21/2020    IR US  GUIDE VASC ACCESS RIGHT  07/13/2020   IR VENOCAVAGRAM IVC  07/13/2020   IR VENOGRAM ADRENAL BI  07/13/2020   IR VENOGRAM RENAL UNI LEFT  07/13/2020   IR VENOUS SAMPLING  07/13/2020   IR VENOUS SAMPLING  07/13/2020   Social History   Socioeconomic History   Marital status: Single    Spouse name: Not on file   Number of children: 1: 39 y/o in 01/2020   Years of education: Not on file   Highest education level: Not on file  Occupational History   Not on file  Tobacco Use   Smoking status: Current Every Day Smoker    Packs/day: 1/2    Types: Cigarettes   Smokeless tobacco: Never Used  Substance and Sexual Activity   Alcohol use: Yes    Comment: occasional, 2 drinks a week   Drug use: No   Sexual activity: Not on file  Other Topics Concern   Not on file  Social History Narrative   Not on file   Social Determinants of Health   Financial Resource Strain:    Difficulty of Paying Living Expenses: Not on file  Food Insecurity:    Worried About Radiation Protection Practitioner of Food in the Last Year: Not on file   [employer] of Food in the Last Year: Not on file  Transportation Needs:    Lack of Transportation (Medical): Not on file   Lack of Transportation (Non-Medical): Not on file  Physical Activity:    Days of Exercise per Week: Not on file   Minutes of Exercise per Session: Not on file  Stress:    Feeling of Stress : Not on file  Social Connections:    Frequency of Communication with Friends and Family: Not on file   Frequency of Social Gatherings with Friends and Family: Not on file   Attends Religious Services: Not on file   Active Member of Clubs or Organizations: Not on file   Attends Banker Meetings: Not on file   Marital Status: Not on file  Intimate Partner Violence:    Fear of Current or Ex-Partner: Not on file   Emotionally Abused: Not on file   Physically Abused: Not on file   Sexually Abused: Not on file   Current Outpatient Medications on File Prior to Visit   Medication Sig Dispense Refill   acetaminophen  (TYLENOL ) 500 MG tablet Take 1,000 mg by mouth every 6 (six) hours as needed for mild pain.     albuterol  (VENTOLIN  HFA) 108 (90 Base) MCG/ACT inhaler Inhale 1-2 puffs into the lungs every 4 (four) hours as needed for wheezing.      amLODipine  (NORVASC ) 10 MG tablet Take 1 tablet (10 mg total) by mouth daily. 90 tablet 3   doxazosin  (CARDURA ) 4 MG tablet Take 1 tablet (4 mg total) by mouth daily. 90 tablet 3   eplerenone  (INSPRA ) 50 MG tablet Take 3 tablets (150 mg total) by mouth 2 (two) times daily. 540 tablet 3   hydrochlorothiazide  (HYDRODIURIL ) 25 MG tablet Take 1 tablet (25 mg total) by mouth daily. 90 tablet 3   potassium chloride  (MICRO-K ) 10 MEQ CR capsule Take 2 capsules (20 mEq total) by mouth daily. 180 capsule 1   No current facility-administered medications on file prior to visit.   No Known Allergies No family history on file.   PE: There were no vitals taken for this visit. Wt Readings from Last 3 Encounters:  03/22/24 (!) 319 lb 12.8 oz (145.1 kg)  08/20/21 (!) 316 lb 9.6 oz (143.6 kg)  03/26/21 ROLLEN)  317 lb 6.4 oz (144 kg)   Constitutional: overweight, in NAD Eyes:  EOMI, no exophthalmos ENT: no neck masses, no cervical lymphadenopathy Cardiovascular: RRR, No MRG Respiratory: CTA B Musculoskeletal: no deformities Skin:no rashes Neurological: + tremor with outstretched hands L>R  ASSESSMENT: 1.  Hyperaldosteronism  2.  Endocrine hypertension  3.  Prediabetes  4. B12 deficiency -He was started on B12 injections through PCPs office, then switch to 1000 mcg B12 p.o. daily -Further management per PCP  5.  Vitamin D deficiency -Previously on ergocalciferol, then came off -Further management per PCP  PLAN: 1.   Primary hyperaldosteronism -Patient with longstanding, uncontrolled, hypertension, on several different antihypertensive medications, diagnosed with primary hyperaldosteronism - For more details, please  review my note from 08/18/2020. -We will continue eplerenone  for blood pressure control.  I did explain that this blocks his aldosterone receptors. -After his diagnosis of hyperaldosteronism and his adrenal vein sampling results returned, I referred him to [facility] for second opinion about management of his PA.  He was called about this but did not go for the appointment. -For now, we will manage his hyperaldosteronism conservatively, per his preference  2. HTN - Blood pressure was excellent at last visit, and remains at goal today - He was previously on a regimen including HCTZ, clonidine, doxazosin , hydralazine , and amlodipine , to which we added eplerenone  in summer 2021.  Afterwards, we were able to stop hydralazine , which helped his fatigue, and we were also able to stop clonidine at night (he was using it once a day due to fatigue). - He is currently on amlodipine  10 mg daily, doxazosin  4 mg daily, hydrochlorothiazide  25 mg daily and eplerenone  150 mg twice a day.  He was previously taking 100 mg 3 times a day but I advised him to reduce this.   - At last visit, his aldosterone was normal, and plasma renin activity was detectable, nonsuppressed, which is our goal for treatment.  His potassium was low, though, so we had to start him back on a potassium supplement of 20 mEq daily. - At today's visit, we will recheck his BMP, aldosterone, and PRA. - I advised him to continue to check his blood pressures at home.  At last visit, he reported blood pressures at home up to 130/80 - At last visit, he had muscle cramps.  We discussed that this could be related to dehydration and advised him to stay well-hydrated.  We also started him on potassium supplements as mentioned above. -I we will see him back in 6 months  3.  Prediabetes - He had 3 HbA1c levels higher than 5.7%, giving him a diagnosis of prediabetes.  Latest: Lab Results  Component Value Date   HGBA1C 5.8 (H) 03/22/2024  - We previously discussed  about the reversible nature of this condition.  I did advise him to try to optimize his weight, improve diet and exercise.  He was planning to restart exercise as he had the equipment at home. - At today's visit, he does not have increased thirst and urination or unintentional weight loss  - we will recheck his HbA1c today  No orders of the defined types were placed in this encounter.  Lela Fendt, MD PhD [facility]
